# Patient Record
Sex: Female | Born: 1980 | Race: Black or African American | Hispanic: No | Marital: Married | Smoking: Never smoker
Health system: Southern US, Community
[De-identification: ages and names within clinical notes are randomized; demographics above are authoritative.]

## PROBLEM LIST (undated history)

## (undated) DIAGNOSIS — Z332 Encounter for elective termination of pregnancy: Secondary | ICD-10-CM

## (undated) HISTORY — PX: OTHER SURGICAL HISTORY: SHX169

---

## 2010-07-26 ENCOUNTER — Other Ambulatory Visit (HOSPITAL_COMMUNITY)
Admission: RE | Admit: 2010-07-26 | Discharge: 2010-07-26 | Disposition: A | Discharge status: Home / Self Care | Payer: Self-pay | Source: Ambulatory Visit | Attending: Family Medicine | Admitting: Family Medicine

## 2010-07-26 DIAGNOSIS — Z124 Encounter for screening for malignant neoplasm of cervix: Secondary | ICD-10-CM | POA: Insufficient documentation

## 2013-07-27 ENCOUNTER — Ambulatory Visit: Payer: Managed Care, Other (non HMO) | Attending: Family Medicine

## 2013-07-27 DIAGNOSIS — R5381 Other malaise: Secondary | ICD-10-CM | POA: Insufficient documentation

## 2013-07-27 DIAGNOSIS — M549 Dorsalgia, unspecified: Secondary | ICD-10-CM | POA: Insufficient documentation

## 2013-07-27 DIAGNOSIS — M545 Low back pain, unspecified: Secondary | ICD-10-CM | POA: Insufficient documentation

## 2013-07-27 DIAGNOSIS — M25659 Stiffness of unspecified hip, not elsewhere classified: Secondary | ICD-10-CM | POA: Insufficient documentation

## 2013-07-27 DIAGNOSIS — IMO0001 Reserved for inherently not codable concepts without codable children: Secondary | ICD-10-CM | POA: Insufficient documentation

## 2013-08-01 ENCOUNTER — Other Ambulatory Visit: Payer: Self-pay | Admitting: Family Medicine

## 2013-08-01 ENCOUNTER — Ambulatory Visit
Admission: RE | Admit: 2013-08-01 | Discharge: 2013-08-01 | Disposition: A | Discharge status: Home / Self Care | Payer: Managed Care, Other (non HMO) | Source: Ambulatory Visit | Attending: Family Medicine | Admitting: Family Medicine

## 2013-08-01 DIAGNOSIS — M549 Dorsalgia, unspecified: Secondary | ICD-10-CM

## 2013-08-09 ENCOUNTER — Ambulatory Visit: Payer: Managed Care, Other (non HMO) | Admitting: Physical Therapy

## 2013-08-16 ENCOUNTER — Ambulatory Visit: Payer: Managed Care, Other (non HMO) | Admitting: Physical Therapy

## 2013-08-23 ENCOUNTER — Ambulatory Visit: Payer: Managed Care, Other (non HMO) | Admitting: Physical Therapy

## 2013-08-30 ENCOUNTER — Ambulatory Visit: Payer: Managed Care, Other (non HMO) | Attending: Family Medicine | Admitting: Physical Therapy

## 2013-08-30 ENCOUNTER — Ambulatory Visit: Payer: Managed Care, Other (non HMO) | Admitting: Physical Therapy

## 2013-08-30 DIAGNOSIS — IMO0001 Reserved for inherently not codable concepts without codable children: Secondary | ICD-10-CM | POA: Insufficient documentation

## 2013-08-30 DIAGNOSIS — R5381 Other malaise: Secondary | ICD-10-CM | POA: Insufficient documentation

## 2013-08-30 DIAGNOSIS — M545 Low back pain, unspecified: Secondary | ICD-10-CM | POA: Insufficient documentation

## 2013-08-30 DIAGNOSIS — M25659 Stiffness of unspecified hip, not elsewhere classified: Secondary | ICD-10-CM | POA: Insufficient documentation

## 2013-08-30 DIAGNOSIS — M549 Dorsalgia, unspecified: Secondary | ICD-10-CM | POA: Insufficient documentation

## 2013-09-01 ENCOUNTER — Ambulatory Visit: Payer: Managed Care, Other (non HMO) | Admitting: Physical Therapy

## 2013-09-06 ENCOUNTER — Ambulatory Visit: Payer: Managed Care, Other (non HMO) | Admitting: Physical Therapy

## 2013-09-13 ENCOUNTER — Ambulatory Visit: Payer: Managed Care, Other (non HMO) | Admitting: Physical Therapy

## 2013-09-20 ENCOUNTER — Ambulatory Visit: Payer: Managed Care, Other (non HMO) | Admitting: Physical Therapy

## 2013-09-22 ENCOUNTER — Ambulatory Visit: Payer: Managed Care, Other (non HMO) | Admitting: Physical Therapy

## 2014-01-12 LAB — OB RESULTS CONSOLE HIV ANTIBODY (ROUTINE TESTING): HIV: NONREACTIVE

## 2014-01-12 LAB — OB RESULTS CONSOLE HEPATITIS B SURFACE ANTIGEN: HEP B S AG: NEGATIVE

## 2014-01-12 LAB — OB RESULTS CONSOLE RUBELLA ANTIBODY, IGM: Rubella: IMMUNE

## 2014-01-12 LAB — OB RESULTS CONSOLE GC/CHLAMYDIA
CHLAMYDIA, DNA PROBE: NEGATIVE
Gonorrhea: NEGATIVE

## 2014-01-18 LAB — OB RESULTS CONSOLE ABO/RH: RH TYPE: POSITIVE

## 2014-01-18 LAB — OB RESULTS CONSOLE ANTIBODY SCREEN: ANTIBODY SCREEN: NEGATIVE

## 2014-04-26 LAB — OB RESULTS CONSOLE HIV ANTIBODY (ROUTINE TESTING): HIV: NONREACTIVE

## 2014-04-26 LAB — OB RESULTS CONSOLE RPR: RPR: NONREACTIVE

## 2014-05-18 ENCOUNTER — Encounter: Payer: Self-pay | Admitting: Neurology

## 2014-05-18 ENCOUNTER — Ambulatory Visit (INDEPENDENT_AMBULATORY_CARE_PROVIDER_SITE_OTHER): Payer: Managed Care, Other (non HMO) | Admitting: Neurology

## 2014-05-18 VITALS — BP 118/70 | HR 96 | Ht 69.0 in | Wt 301.9 lb

## 2014-05-18 DIAGNOSIS — G5622 Lesion of ulnar nerve, left upper limb: Secondary | ICD-10-CM

## 2014-05-18 NOTE — Patient Instructions (Addendum)
1.  Start using an soft elbow pad and avoid hyperflexion of the elbow 2.  EMG of the right arm in late March 3.  Start occupational therapy 4.  Return to clinic in April

## 2014-05-18 NOTE — Progress Notes (Signed)
[facility] Neurology Division Clinic Note - Initial Visit   Date: 05/18/2014  Adriana Cheshireanisha L Rayfield MRN: [id number] DOB: 11/02/1980   Dear Dr. Kateri PlummerMorrow:   Thank you for your kind referral of Marily Lenteanisha L Kniskern for consultation of right hand numbness. Although her history is well known to you, please allow us to reiterate it for the purpose of our medical record. The patient was accompanied to the clinic by self.    History of Present Illness: Adriana Skinner is a 33 y.o. right-handed [place] female with no prior medical history presenting for evaluation of right hand paresthesias.  She is currently [redacted] weeks pregnant.  Starting in October 2015, she developing tingling over the 5th digit which started when she was sleeping. Symptoms are not constant, but can feel tingling sensation radiating from the elbow to the pinky finger.  Recently she noticed similar symptoms into the 4th finger. She also feels that her 5th fingers stays slightly separated from her 4th digit and has noted mild flexion deformity.  Denies any neck pain or weakness or similar symptoms on the left side.    She also complains of right leg sciatica which is most likely pregnancy-related.  Past medical history:  None  Past Surgical History  Procedure Laterality Date  . C-section     Medications:  Prenatal multivitamin  Allergies: No Known Allergies  Family History: Family History  Problem Relation Age of Onset  . Diabetes Mellitus II Father   . Healthy Mother   . Healthy Brother     x3  . Healthy Son   . Healthy Daughter     Social History: History   Social History  . Marital Status: Married    Spouse Name: N/A    Number of Children: N/A  . Years of Education: N/A   Occupational History  . Not on file.   Social History Main Topics  . Smoking status: Never Smoker   . Smokeless tobacco: Not on file  . Alcohol Use: No     Comment: socially   . Drug Use: No  . Sexual Activity: Not  on file   Other Topics Concern  . Not on file   Social History Narrative   She has a cleaning business.   Highest level of education:  Bachelors   She lives with husband and two children.    Review of Systems:  CONSTITUTIONAL: No fevers, chills, night sweats, or weight loss.   EYES: No visual changes or eye pain ENT: No hearing changes.  No history of nose bleeds.   RESPIRATORY: No cough, wheezing and shortness of breath.   CARDIOVASCULAR: Negative for chest pain, and palpitations.   GI: Negative for abdominal discomfort, blood in stools or black stools.  No recent change in bowel habits.   GU:  No history of incontinence.   MUSCLOSKELETAL: No history of joint pain or swelling.  No myalgias.   SKIN: Negative for lesions, rash, and itching.   HEMATOLOGY/ONCOLOGY: Negative for prolonged bleeding, bruising easily, and swollen nodes.  No history of cancer.   ENDOCRINE: Negative for cold or heat intolerance, polydipsia or goiter.   PSYCH:  No depression or anxiety symptoms.   NEURO: As Above.   Vital Signs:  BP 118/70 mmHg  Pulse 96  Ht 5\' 9"  (1.753 m)  Wt 301 lb 14.4 oz (136.941 kg)  BMI 44.56 kg/m2  SpO2 96%   General Medical Exam:   General:  Well appearing, comfortable.   Eyes/ENT: see cranial  nerve examination.   Neck: No masses appreciated.  Full range of motion without tenderness.  No carotid bruits. Respiratory:  Clear to auscultation, good air entry bilaterally.   Cardiac:  Regular rate and rhythm, no murmur.   Extremities:  No deformities, edema, or skin discoloration.  Skin:  No rashes or lesions.  Neurological Exam: MENTAL STATUS including orientation to time, place, person, recent and remote memory, attention span and concentration, language, and fund of knowledge is normal.  Speech is not dysarthric.  CRANIAL NERVES: II:  No visual field defects.  Unremarkable fundi.   III-IV-VI: Pupils equal round and reactive to light.  Normal conjugate, extra-ocular eye  movements in all directions of gaze.  No nystagmus.  No ptosis.   V:  Normal facial sensation.  Jaw jerk is absent.   VII:  Normal facial symmetry and movements.  No pathologic facial reflexes.  VIII:  Normal hearing and vestibular function.   IX-X:  Normal palatal movement.   XI:  Normal shoulder shrug and head rotation.   XII:  Normal tongue strength and range of motion, no deviation or fasciculation.  MOTOR: Right fifth digit is partly abducted and has flexion deformity at the PIP (early dupuytren's contracture?).   No atrophy, fasciculations or abnormal movements.  No pronator drift.  Tone is normal.  Froment's sign is positive on the right.  Right Upper Extremity:    Left Upper Extremity:    Deltoid  5/5   Deltoid  5/5   Biceps  5/5   Biceps  5/5   Triceps  5/5   Triceps  5/5   Wrist extensors  5/5   Wrist extensors  5/5   Wrist flexors  5/5   Wrist flexors  5/5   Finger extensors  5/5   Finger extensors  5/5   FDP 4/5 4+/5  FDP 4/5 5/5  Finger flexors  5/5   Finger flexors  5/5   Dorsal interossei  4+/5   Dorsal interossei  5/5   Abductor pollicis  5/5   Abductor pollicis  5/5   Tone (Ashworth scale)  0  Tone (Ashworth scale)  0   Right Lower Extremity:    Left Lower Extremity:    Hip flexors  5/5   Hip flexors  5/5   Hip extensors  5/5   Hip extensors  5/5   Knee flexors  5/5   Knee flexors  5/5   Knee extensors  5/5   Knee extensors  5/5   Dorsiflexors  5/5   Dorsiflexors  5/5   Plantarflexors  5/5   Plantarflexors  5/5   Toe extensors  5/5   Toe extensors  5/5   Toe flexors  5/5   Toe flexors  5/5   Tone (Ashworth scale)  0  Tone (Ashworth scale)  0   MSRs:  Reflexes are brisk and symmetric throughout (3+/4), no other upper motor neuron findings.  Hoffman's negative.  Plantars are down going.  SENSORY: Hyperesthesias to pin prick over the right lateral hand and 5th digit.  Otherwise, sensation is intact throughout.  COORDINATION/GAIT: Normal finger-to- nose-finger.   Intact rapid alternating movements bilaterally.  Able to rise from a chair without using arms.  Gait narrow based and stable.  DATA: XR lumbar spine 08/01/2013:  No acute findings. No findings to explain the patient's pain.  IMPRESSION: Ms. Sallee Langeridgen is a delightful 33 year-old female presenting for evaluation of right hand paresthesias.  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  Symptoms are intermittent and not bothersome enough to start neuralgesic medication.  Electrodiagnostic testing of the right arm will be performed if her symptoms do not improvement with conservative measures.      PLAN/RECOMMENDATIONS:  1.  Start using elbow pad and avoid compression/repetitive trauma to the elbow 2.  Avoid hyperflexion of the elbow 3.  EMG of the right arm 4.  Start occupational therapy since there is evidence of weakness involving ulnar-innervated muscles 5.  Return to clinic in 3 months   The duration of this appointment visit was 45 minutes of face-to-face time with the patient.  Greater than 50% of this time was spent in counseling, explanation of diagnosis, planning of further management, and coordination of care.   Thank you for allowing me to participate in patient's care.  If I can answer any additional questions, I would be pleased to do so.    Sincerely,    Anabelle Bungert K. Allena Katz, DO

## 2014-06-19 LAB — OB RESULTS CONSOLE GBS: GBS: NEGATIVE

## 2014-07-05 ENCOUNTER — Telehealth: Payer: Self-pay | Admitting: Neurology

## 2014-07-05 NOTE — Telephone Encounter (Signed)
Noted  

## 2014-07-05 NOTE — Telephone Encounter (Signed)
Pt is not longer having problems and canceled appt for the EMG and will call back if she needs to resch appt

## 2014-07-12 ENCOUNTER — Encounter (HOSPITAL_COMMUNITY): Payer: Self-pay

## 2014-07-14 ENCOUNTER — Encounter (HOSPITAL_COMMUNITY): Payer: Self-pay

## 2014-07-14 ENCOUNTER — Encounter (HOSPITAL_COMMUNITY)
Admission: RE | Admit: 2014-07-14 | Discharge: 2014-07-14 | Disposition: A | Discharge status: Home / Self Care | Payer: Managed Care, Other (non HMO) | Source: Ambulatory Visit | Attending: Obstetrics and Gynecology | Admitting: Obstetrics and Gynecology

## 2014-07-14 HISTORY — DX: Encounter for elective termination of pregnancy: Z33.2

## 2014-07-14 LAB — CBC
HCT: 33.2 % — ABNORMAL LOW (ref 36.0–46.0)
Hemoglobin: 11.5 g/dL — ABNORMAL LOW (ref 12.0–15.0)
MCH: 31.9 pg (ref 26.0–34.0)
MCHC: 34.6 g/dL (ref 30.0–36.0)
MCV: 92 fL (ref 78.0–100.0)
Platelets: 209 10*3/uL (ref 150–400)
RBC: 3.61 MIL/uL — AB (ref 3.87–5.11)
RDW: 13 % (ref 11.5–15.5)
WBC: 6.3 10*3/uL (ref 4.0–10.5)

## 2014-07-14 LAB — ABO/RH: ABO/RH(D): O POS

## 2014-07-14 NOTE — Patient Instructions (Addendum)
   Your procedure is scheduled on: Monday, Feb 22  Enter through the Main Entrance of [facility] at: 6 AM Pick up the phone at the desk and dial [phone] and inform us of your arrival.  Please call this number if you have any problems the morning of surgery: [phone]  Remember: Do not eat or drink after midnight: Sunday Take these medicines the morning of surgery with a SIP OF WATER: None  Do not wear jewelry, make-up, or FINGER nail polish No metal in your hair or on your body. Do not wear lotions, powders, perfumes.  You may wear deodorant.  Do not bring valuables to the hospital. Contacts, dentures or bridgework may not be worn into surgery.  Leave suitcase in the car. After Surgery it may be brought to your room. For patients being admitted to the hospital, checkout time is 11:00am the day of discharge.  Home with husband Adriana Skinner cell [phone]

## 2014-07-15 LAB — RPR: RPR Ser Ql: NONREACTIVE

## 2014-07-16 MED ORDER — DEXTROSE 5 % IV SOLN
3.0000 g | INTRAVENOUS | Status: AC
  Administered 2014-07-17: 3 g via INTRAVENOUS
  Filled 2014-07-16: qty 3000

## 2014-07-16 NOTE — H&P (Signed)
Adriana Skinner is a 34 y.o. female [phone] art 39+ weeks (EDD 07/22/14 by 11 week US and unsure LMP)  presenting for frepeat scheduled c-section given a h/o of 2 previous c-sections.  She had no other significant issues this pregnancy.  Maternal Medical History:  Contractions: Frequency: irregular.   Perceived severity is mild.    Fetal activity: Perceived fetal activity is normal.    Prenatal Complications - Diabetes: none.    OB History    Gravida Para Term Preterm AB TAB SAB Ectopic Multiple Living   5 2 2  2 2    4     EAB x 2 2007 C-Section in [place] 8#7oz Arrest of Labor 2008 9#8ox Repeat C-section  Past Medical History  Diagnosis Date  . Termination of pregnancy (fetus) 2001, 2005    x 2   Past Surgical History  Procedure Laterality Date  . C-section  2007, 2008    x 2 both in [place]  . Wisdom teeht ext     Family History: family history includes Diabetes Mellitus II in her father; Healthy in her brother, daughter, mother, and son. Social History:  reports that she has never smoked. She has never used smokeless tobacco. She reports that she drinks alcohol. She reports that she does not use illicit drugs.   Prenatal Transfer Tool  Maternal Diabetes: No Genetic Screening: Normal Maternal Ultrasounds/Referrals: Normal Fetal Ultrasounds or other Referrals:  None Maternal Substance Abuse:  No Significant Maternal Medications:  None Significant Maternal Lab Results:  None Other Comments:  None  ROS    There were no vitals taken for this visit. Maternal Exam:  Uterine Assessment: Contraction strength is mild.  Contraction frequency is irregular.   Abdomen: Patient reports no abdominal tenderness. Surgical scars: low transverse.   Introitus: Normal vulva. Normal vagina.    Physical Exam  Constitutional: She appears well-developed and well-nourished.  Cardiovascular: Normal rate and regular rhythm.   Respiratory: Effort normal and breath sounds normal.  GI: Soft.   Genitourinary: Vagina normal.  Neurological: She is alert.  Psychiatric: She has a normal mood and affect.    Prenatal labs: ABO, Rh: --/--/O POS, O POS (02/19 14780905) Antibody: NEG (02/19 0905) Rubella: Immune (08/20 0000) RPR: Non Reactive (02/19 0905)  HBsAg: Negative (08/20 0000)  HIV: Non-reactive (12/02 0000)  GBS: Negative (01/25 0000) 124 Hgb AA First trimester screen and AFP negative One hour GTT   Assessment/Plan: Patient was counseled regarding the risks and benefits of C-section and the procedure was reviewed in detail. Risks of bleeding, infection, and possible damage to bowel and bladder were reviewed, The patient would accept a blood transfusion if needed. She desires to proceed.    Oliver PilaICHARDSON,Skylen Spiering W 07/16/2014, 1:04 PM

## 2014-07-16 NOTE — Anesthesia Preprocedure Evaluation (Signed)
Anesthesia Evaluation  Patient identified by MRN, date of birth, ID band Patient awake    Reviewed: Allergy & Precautions, NPO status , Patient's Chart, lab work & pertinent test results  History of Anesthesia Complications Negative for: history of anesthetic complications  Airway Mallampati: III  TM Distance: >3 FB Neck ROM: Full    Dental no notable dental hx. (+) Dental Advisory Given   Pulmonary neg pulmonary ROS,  breath sounds clear to auscultation  Pulmonary exam normal       Cardiovascular negative cardio ROS  Rhythm:Regular Rate:Normal     Neuro/Psych negative neurological ROS  negative psych ROS   GI/Hepatic negative GI ROS, Neg liver ROS,   Endo/Other  Morbid obesity  Renal/GU negative Renal ROS  negative genitourinary   Musculoskeletal negative musculoskeletal ROS (+)   Abdominal (+) + obese,   Peds negative pediatric ROS (+)  Hematology negative hematology ROS (+)   Anesthesia Other Findings   Reproductive/Obstetrics (+) Pregnancy                             Anesthesia Physical Anesthesia Plan  ASA: III  Anesthesia Plan: Combined Spinal and Epidural and Spinal   Post-op Pain Management:    Induction:   Airway Management Planned:   Additional Equipment:   Intra-op Plan:   Post-operative Plan:   Informed Consent: I have reviewed the patients History and Physical, chart, labs and discussed the procedure including the risks, benefits and alternatives for the proposed anesthesia with the patient or authorized representative who has indicated his/her understanding and acceptance.   Dental advisory given  Plan Discussed with: CRNA  Anesthesia Plan Comments:         Anesthesia Quick Evaluation

## 2014-07-17 ENCOUNTER — Inpatient Hospital Stay (HOSPITAL_COMMUNITY): Payer: Managed Care, Other (non HMO) | Admitting: Anesthesiology

## 2014-07-17 ENCOUNTER — Encounter (HOSPITAL_COMMUNITY): Payer: Self-pay | Admitting: *Deleted

## 2014-07-17 ENCOUNTER — Encounter (HOSPITAL_COMMUNITY)
Admission: RE | Disposition: A | Discharge status: Home / Self Care | Payer: Self-pay | Source: Ambulatory Visit | Attending: Obstetrics and Gynecology

## 2014-07-17 ENCOUNTER — Inpatient Hospital Stay (HOSPITAL_COMMUNITY)
Admission: RE | Admit: 2014-07-17 | Discharge: 2014-07-20 | DRG: 766 | Disposition: A | Discharge status: Home / Self Care | Payer: Managed Care, Other (non HMO) | Source: Ambulatory Visit | Attending: Obstetrics and Gynecology | Admitting: Obstetrics and Gynecology

## 2014-07-17 DIAGNOSIS — O3421 Maternal care for scar from previous cesarean delivery: Secondary | ICD-10-CM | POA: Diagnosis present

## 2014-07-17 DIAGNOSIS — Z3A39 39 weeks gestation of pregnancy: Secondary | ICD-10-CM | POA: Diagnosis present

## 2014-07-17 DIAGNOSIS — Z98891 History of uterine scar from previous surgery: Secondary | ICD-10-CM

## 2014-07-17 DIAGNOSIS — Z3483 Encounter for supervision of other normal pregnancy, third trimester: Secondary | ICD-10-CM | POA: Diagnosis present

## 2014-07-17 LAB — PREPARE RBC (CROSSMATCH)

## 2014-07-17 SURGERY — Surgical Case
Anesthesia: Spinal | Site: Abdomen

## 2014-07-17 MED ORDER — BUPIVACAINE IN DEXTROSE 0.75-8.25 % IT SOLN
INTRATHECAL | Status: DC | PRN
  Administered 2014-07-17: 1.8 mL via INTRATHECAL

## 2014-07-17 MED ORDER — IBUPROFEN 600 MG PO TABS
600.0000 mg | ORAL_TABLET | Freq: Four times a day (QID) | ORAL | Status: DC
  Administered 2014-07-17 – 2014-07-20 (×12): 600 mg via ORAL
  Filled 2014-07-17 (×11): qty 1

## 2014-07-17 MED ORDER — NALBUPHINE HCL 10 MG/ML IJ SOLN: 5.0000 mg | INTRAMUSCULAR | Status: DC | PRN

## 2014-07-17 MED ORDER — DIPHENHYDRAMINE HCL 50 MG/ML IJ SOLN: 12.5000 mg | INTRAMUSCULAR | Status: DC | PRN

## 2014-07-17 MED ORDER — ZOLPIDEM TARTRATE 5 MG PO TABS: 5.0000 mg | ORAL_TABLET | Freq: Every evening | ORAL | Status: DC | PRN

## 2014-07-17 MED ORDER — ONDANSETRON HCL 4 MG/2ML IJ SOLN
INTRAMUSCULAR | Status: DC | PRN
  Administered 2014-07-17: 4 mg via INTRAVENOUS

## 2014-07-17 MED ORDER — NALOXONE HCL 1 MG/ML IJ SOLN
1.0000 ug/kg/h | INTRAVENOUS | Status: DC | PRN
  Filled 2014-07-17: qty 2

## 2014-07-17 MED ORDER — PHENYLEPHRINE HCL 10 MG/ML IJ SOLN
INTRAMUSCULAR | Status: DC | PRN
  Administered 2014-07-17: 80 ug via INTRAVENOUS

## 2014-07-17 MED ORDER — PHENYLEPHRINE 8 MG IN D5W 100 ML (0.08MG/ML) PREMIX OPTIME
INJECTION | INTRAVENOUS | Status: DC | PRN
  Administered 2014-07-17: 60 ug/min via INTRAVENOUS

## 2014-07-17 MED ORDER — ACETAMINOPHEN 500 MG PO TABS
1000.0000 mg | ORAL_TABLET | Freq: Four times a day (QID) | ORAL | Status: AC
Start: 2014-07-17 — End: 2014-07-18
  Administered 2014-07-17 (×2): 1000 mg via ORAL
  Filled 2014-07-17 (×2): qty 2

## 2014-07-17 MED ORDER — SCOPOLAMINE 1 MG/3DAYS TD PT72
MEDICATED_PATCH | TRANSDERMAL | Status: AC
  Administered 2014-07-17: 1.5 mg via TRANSDERMAL
  Filled 2014-07-17: qty 1

## 2014-07-17 MED ORDER — MENTHOL 3 MG MT LOZG: 1.0000 | LOZENGE | OROMUCOSAL | Status: DC | PRN

## 2014-07-17 MED ORDER — OXYTOCIN 10 UNIT/ML IJ SOLN
INTRAMUSCULAR | Status: AC
  Filled 2014-07-17: qty 4

## 2014-07-17 MED ORDER — DIPHENHYDRAMINE HCL 25 MG PO CAPS: 25.0000 mg | ORAL_CAPSULE | ORAL | Status: DC | PRN

## 2014-07-17 MED ORDER — FENTANYL CITRATE 0.05 MG/ML IJ SOLN
INTRAMUSCULAR | Status: AC
  Filled 2014-07-17: qty 2

## 2014-07-17 MED ORDER — NALBUPHINE HCL 10 MG/ML IJ SOLN
5.0000 mg | Freq: Once | INTRAMUSCULAR | Status: AC | PRN
Start: 2014-07-17 — End: 2014-07-17

## 2014-07-17 MED ORDER — MORPHINE SULFATE (PF) 0.5 MG/ML IJ SOLN
INTRAMUSCULAR | Status: DC | PRN
  Administered 2014-07-17: .2 mg via EPIDURAL

## 2014-07-17 MED ORDER — KETOROLAC TROMETHAMINE 30 MG/ML IJ SOLN
30.0000 mg | Freq: Four times a day (QID) | INTRAMUSCULAR | Status: AC | PRN
  Administered 2014-07-17: 30 mg via INTRAMUSCULAR

## 2014-07-17 MED ORDER — PHENYLEPHRINE 8 MG IN D5W 100 ML (0.08MG/ML) PREMIX OPTIME
INJECTION | INTRAVENOUS | Status: AC
  Filled 2014-07-17: qty 100

## 2014-07-17 MED ORDER — FENTANYL CITRATE 0.05 MG/ML IJ SOLN
INTRAMUSCULAR | Status: DC | PRN
Start: 2014-07-17 — End: 2014-07-17
  Administered 2014-07-17: 10 ug via INTRAVENOUS

## 2014-07-17 MED ORDER — ERYTHROMYCIN 5 MG/GM OP OINT
TOPICAL_OINTMENT | OPHTHALMIC | Status: AC
  Filled 2014-07-17: qty 1

## 2014-07-17 MED ORDER — SCOPOLAMINE 1 MG/3DAYS TD PT72
1.0000 | MEDICATED_PATCH | Freq: Once | TRANSDERMAL | Status: DC
  Administered 2014-07-17: 1.5 mg via TRANSDERMAL

## 2014-07-17 MED ORDER — LACTATED RINGERS IV SOLN: INTRAVENOUS | Status: DC

## 2014-07-17 MED ORDER — KETOROLAC TROMETHAMINE 30 MG/ML IJ SOLN
INTRAMUSCULAR | Status: AC
  Filled 2014-07-17: qty 1

## 2014-07-17 MED ORDER — MORPHINE SULFATE 0.5 MG/ML IJ SOLN
INTRAMUSCULAR | Status: AC
  Filled 2014-07-17: qty 10

## 2014-07-17 MED ORDER — NALBUPHINE HCL 10 MG/ML IJ SOLN: 5.0000 mg | Freq: Once | INTRAMUSCULAR | Status: AC | PRN

## 2014-07-17 MED ORDER — SIMETHICONE 80 MG PO CHEW
80.0000 mg | CHEWABLE_TABLET | ORAL | Status: DC
  Administered 2014-07-17 – 2014-07-20 (×3): 80 mg via ORAL
  Filled 2014-07-17 (×3): qty 1

## 2014-07-17 MED ORDER — OXYCODONE-ACETAMINOPHEN 5-325 MG PO TABS
1.0000 | ORAL_TABLET | ORAL | Status: DC | PRN
  Administered 2014-07-18 (×2): 1 via ORAL
  Filled 2014-07-17 (×2): qty 1

## 2014-07-17 MED ORDER — LACTATED RINGERS IV SOLN
INTRAVENOUS | Status: DC
  Administered 2014-07-17: 16:00:00 via INTRAVENOUS

## 2014-07-17 MED ORDER — OXYTOCIN 10 UNIT/ML IJ SOLN
40.0000 [IU] | INTRAVENOUS | Status: DC | PRN
  Administered 2014-07-17: 40 [IU] via INTRAVENOUS

## 2014-07-17 MED ORDER — DIPHENHYDRAMINE HCL 25 MG PO CAPS: 25.0000 mg | ORAL_CAPSULE | Freq: Four times a day (QID) | ORAL | Status: DC | PRN

## 2014-07-17 MED ORDER — MEPERIDINE HCL 25 MG/ML IJ SOLN
6.2500 mg | INTRAMUSCULAR | Status: DC | PRN
Start: 2014-07-17 — End: 2014-07-17

## 2014-07-17 MED ORDER — ONDANSETRON HCL 4 MG/2ML IJ SOLN
INTRAMUSCULAR | Status: AC
  Filled 2014-07-17: qty 2

## 2014-07-17 MED ORDER — NALOXONE HCL 0.4 MG/ML IJ SOLN: 0.4000 mg | INTRAMUSCULAR | Status: DC | PRN

## 2014-07-17 MED ORDER — TETANUS-DIPHTH-ACELL PERTUSSIS 5-2.5-18.5 LF-MCG/0.5 IM SUSP: 0.5000 mL | Freq: Once | INTRAMUSCULAR | Status: DC

## 2014-07-17 MED ORDER — KETOROLAC TROMETHAMINE 30 MG/ML IJ SOLN: 30.0000 mg | Freq: Four times a day (QID) | INTRAMUSCULAR | Status: AC | PRN

## 2014-07-17 MED ORDER — OXYCODONE-ACETAMINOPHEN 5-325 MG PO TABS
2.0000 | ORAL_TABLET | ORAL | Status: DC | PRN
  Administered 2014-07-19 – 2014-07-20 (×5): 2 via ORAL
  Filled 2014-07-17 (×5): qty 2

## 2014-07-17 MED ORDER — DIBUCAINE 1 % RE OINT: 1.0000 "application " | TOPICAL_OINTMENT | RECTAL | Status: DC | PRN

## 2014-07-17 MED ORDER — ONDANSETRON HCL 4 MG/2ML IJ SOLN: 4.0000 mg | INTRAMUSCULAR | Status: DC | PRN

## 2014-07-17 MED ORDER — ONDANSETRON HCL 4 MG/2ML IJ SOLN: 4.0000 mg | Freq: Three times a day (TID) | INTRAMUSCULAR | Status: DC | PRN

## 2014-07-17 MED ORDER — ONDANSETRON HCL 4 MG/2ML IJ SOLN: INTRAMUSCULAR | Status: DC | PRN

## 2014-07-17 MED ORDER — WITCH HAZEL-GLYCERIN EX PADS: 1.0000 "application " | MEDICATED_PAD | CUTANEOUS | Status: DC | PRN

## 2014-07-17 MED ORDER — ONDANSETRON HCL 4 MG PO TABS: 4.0000 mg | ORAL_TABLET | ORAL | Status: DC | PRN

## 2014-07-17 MED ORDER — SENNOSIDES-DOCUSATE SODIUM 8.6-50 MG PO TABS
2.0000 | ORAL_TABLET | ORAL | Status: DC
  Administered 2014-07-17 – 2014-07-20 (×3): 2 via ORAL
  Filled 2014-07-17 (×3): qty 2

## 2014-07-17 MED ORDER — ONDANSETRON HCL 4 MG/2ML IJ SOLN: 4.0000 mg | Freq: Once | INTRAMUSCULAR | Status: DC | PRN

## 2014-07-17 MED ORDER — LANOLIN HYDROUS EX OINT: 1.0000 "application " | TOPICAL_OINTMENT | CUTANEOUS | Status: DC | PRN

## 2014-07-17 MED ORDER — PHENYLEPHRINE 40 MCG/ML (10ML) SYRINGE FOR IV PUSH (FOR BLOOD PRESSURE SUPPORT)
PREFILLED_SYRINGE | INTRAVENOUS | Status: AC
  Filled 2014-07-17: qty 10

## 2014-07-17 MED ORDER — SCOPOLAMINE 1 MG/3DAYS TD PT72
1.0000 | MEDICATED_PATCH | Freq: Once | TRANSDERMAL | Status: DC
  Filled 2014-07-17: qty 1

## 2014-07-17 MED ORDER — LACTATED RINGERS IV SOLN
Freq: Once | INTRAVENOUS | Status: AC
  Administered 2014-07-17: 07:00:00 via INTRAVENOUS

## 2014-07-17 MED ORDER — SIMETHICONE 80 MG PO CHEW: 80.0000 mg | CHEWABLE_TABLET | ORAL | Status: DC | PRN

## 2014-07-17 MED ORDER — SIMETHICONE 80 MG PO CHEW
80.0000 mg | CHEWABLE_TABLET | Freq: Three times a day (TID) | ORAL | Status: DC
  Administered 2014-07-18 – 2014-07-19 (×6): 80 mg via ORAL
  Filled 2014-07-17 (×6): qty 1

## 2014-07-17 MED ORDER — FENTANYL CITRATE 0.05 MG/ML IJ SOLN: 25.0000 ug | INTRAMUSCULAR | Status: DC | PRN

## 2014-07-17 MED ORDER — OXYTOCIN 40 UNITS IN LACTATED RINGERS INFUSION - SIMPLE MED: 62.5000 mL/h | INTRAVENOUS | Status: AC

## 2014-07-17 MED ORDER — LACTATED RINGERS IV SOLN
INTRAVENOUS | Status: DC
  Administered 2014-07-17 (×3): via INTRAVENOUS

## 2014-07-17 MED ORDER — 0.9 % SODIUM CHLORIDE (POUR BTL) OPTIME
TOPICAL | Status: DC | PRN
  Administered 2014-07-17: 1000 mL

## 2014-07-17 MED ORDER — PRENATAL MULTIVITAMIN CH
1.0000 | ORAL_TABLET | Freq: Every day | ORAL | Status: DC
  Administered 2014-07-18 – 2014-07-19 (×2): 1 via ORAL
  Filled 2014-07-17 (×2): qty 1

## 2014-07-17 MED ORDER — SODIUM CHLORIDE 0.9 % IJ SOLN: 3.0000 mL | INTRAMUSCULAR | Status: DC | PRN

## 2014-07-17 SURGICAL SUPPLY — 33 items
BENZOIN TINCTURE PRP APPL 2/3 (GAUZE/BANDAGES/DRESSINGS) ×3 IMPLANT
CLAMP CORD UMBIL (MISCELLANEOUS) IMPLANT
CLOSURE WOUND 1/2 X4 (GAUZE/BANDAGES/DRESSINGS) ×2
CLOTH BEACON ORANGE TIMEOUT ST (SAFETY) ×3 IMPLANT
DRAPE SHEET LG 3/4 BI-LAMINATE (DRAPES) IMPLANT
DRSG OPSITE POSTOP 4X10 (GAUZE/BANDAGES/DRESSINGS) ×3 IMPLANT
DURAPREP 26ML APPLICATOR (WOUND CARE) ×3 IMPLANT
ELECT REM PT RETURN 9FT ADLT (ELECTROSURGICAL) ×3
ELECTRODE REM PT RTRN 9FT ADLT (ELECTROSURGICAL) ×1 IMPLANT
EXTRACTOR VACUUM KIWI (MISCELLANEOUS) IMPLANT
GLOVE BIO SURGEON STRL SZ 6.5 (GLOVE) ×2 IMPLANT
GLOVE BIO SURGEONS STRL SZ 6.5 (GLOVE) ×1
GOWN STRL REUS W/TWL LRG LVL3 (GOWN DISPOSABLE) ×6 IMPLANT
HOVERMATT SINGLE USE (MISCELLANEOUS) ×3 IMPLANT
KIT ABG SYR 3ML LUER SLIP (SYRINGE) IMPLANT
NEEDLE HYPO 25X5/8 SAFETYGLIDE (NEEDLE) IMPLANT
NS IRRIG 1000ML POUR BTL (IV SOLUTION) ×3 IMPLANT
PACK C SECTION WH (CUSTOM PROCEDURE TRAY) ×3 IMPLANT
PAD OB MATERNITY 4.3X12.25 (PERSONAL CARE ITEMS) ×3 IMPLANT
RTRCTR C-SECT PINK 25CM LRG (MISCELLANEOUS) ×3 IMPLANT
STRIP CLOSURE SKIN 1/2X4 (GAUZE/BANDAGES/DRESSINGS) ×4 IMPLANT
SUT CHROMIC 1 CTX 36 (SUTURE) ×6 IMPLANT
SUT PLAIN 0 NONE (SUTURE) IMPLANT
SUT PLAIN 2 0 XLH (SUTURE) ×3 IMPLANT
SUT VIC AB 0 CT1 27 (SUTURE) ×4
SUT VIC AB 0 CT1 27XBRD ANBCTR (SUTURE) ×2 IMPLANT
SUT VIC AB 2-0 CT1 27 (SUTURE) ×2
SUT VIC AB 2-0 CT1 TAPERPNT 27 (SUTURE) ×1 IMPLANT
SUT VIC AB 3-0 CT1 27 (SUTURE)
SUT VIC AB 3-0 CT1 TAPERPNT 27 (SUTURE) IMPLANT
SUT VIC AB 4-0 KS 27 (SUTURE) ×3 IMPLANT
TOWEL OR 17X24 6PK STRL BLUE (TOWEL DISPOSABLE) ×3 IMPLANT
TRAY FOLEY CATH 14FR (SET/KITS/TRAYS/PACK) ×3 IMPLANT

## 2014-07-17 NOTE — Anesthesia Procedure Notes (Signed)
Spinal Patient location during procedure: OR Start time: 07/17/2014 7:20 AM Staffing Anesthesiologist: Felipe DroneJUDD, Nancie Bocanegra JENNETTE Performed by: anesthesiologist  Preanesthetic Checklist Completed: patient identified, site marked, surgical consent, pre-op evaluation, timeout performed, IV checked, risks and benefits discussed and monitors and equipment checked Spinal Block Patient position: sitting Prep: DuraPrep Patient monitoring: continuous pulse ox, blood pressure and heart rate Approach: midline Location: L3-4 Injection technique: single-shot Needle Needle type: Sprotte  Needle gauge: 24 G Needle length: 9 cm Assessment Sensory level: T4 Additional Notes Functioning IV was confirmed and monitors were applied. Sterile prep and drape, including hand hygiene, mask and sterile gloves were used. The patient was positioned and the spine was prepped. The skin was anesthetized with lidocaine.  Free flow of clear CSF was obtained prior to injecting local anesthetic into the CSF.  The spinal needle aspirated freely following injection.  The needle was carefully withdrawn.  The patient tolerated the procedure well. Consent was obtained prior to procedure with all questions answered and concerns addressed. Risks including but not limited to bleeding, infection, nerve damage, paralysis, failed block, inadequate analgesia, allergic reaction, high spinal, itching and headache were discussed and the patient wished to proceed.   Karie SchwalbeMary Derenda Giddings, MD

## 2014-07-17 NOTE — Anesthesia Postprocedure Evaluation (Signed)
  Anesthesia Post-op Note  Patient: Adriana Skinner  Procedure(s) Performed: Procedure(s) (LRB): CESAREAN SECTION-repeat  (N/A)  Patient Location: PACU  Anesthesia Type: Spinal  Level of Consciousness: awake and alert   Airway and Oxygen Therapy: Patient Spontanous Breathing  Post-op Pain: mild  Post-op Assessment: Post-op Vital signs reviewed, Patient's Cardiovascular Status Stable, Respiratory Function Stable, Patent Airway and No signs of Nausea or vomiting  Last Vitals:  Filed Vitals:   07/17/14 1015  BP:   Pulse: 71  Temp:   Resp: 15    Post-op Vital Signs: stable   Complications: No apparent anesthesia complications

## 2014-07-17 NOTE — Progress Notes (Signed)
Patient ID: Adriana Skinner, female   DOB: [birth year], 34 y.o.   MRN: [id number] Per pt no changes in dictated H&P.  Brief exam WNL.  Ready to proceed.

## 2014-07-17 NOTE — Transfer of Care (Signed)
Immediate Anesthesia Transfer of Care Note  Patient: Adriana Skinner  Procedure(s) Performed: Procedure(s): CESAREAN SECTION-repeat  (N/A)  Patient Location: PACU  Anesthesia Type:Spinal  Level of Consciousness: awake, alert  and oriented  Airway & Oxygen Therapy: Patient Spontanous Breathing  Post-op Assessment: Report given to RN, Post -op Vital signs reviewed and stable and Patient moving all extremities  Post vital signs: Reviewed and stable  Last Vitals:  Filed Vitals:   07/17/14 0615  BP: 155/97  Pulse: 93  Temp: 36.9 C  Resp: 18    Complications: No apparent anesthesia complications

## 2014-07-17 NOTE — Anesthesia Postprocedure Evaluation (Signed)
Anesthesia Post Note  Patient: Adriana Skinner  Procedure(s) Performed: Procedure(s) (LRB): CESAREAN SECTION-repeat  (N/A)  Anesthesia type: Spinal  Patient location: Mother/Baby  Post pain: Pain level controlled  Post assessment: Post-op Vital signs reviewed  Last Vitals:  Filed Vitals:   07/17/14 1318  BP: 142/76  Pulse: 73  Temp: 36.8 C  Resp: 20    Post vital signs: Reviewed  Level of consciousness: awake  Complications: No apparent anesthesia complications

## 2014-07-17 NOTE — Lactation Note (Signed)
This note was copied from the chart of Girl Kelby Famanisha Laba. Lactation Consultation Note  Patient Name: Girl Kelby Famanisha Bonine ZOXWR'UToday's Date: 07/17/2014 Reason for consult: Initial assessment Mom is an experienced breast feeder. Baby is 6 hours old and has been to the breast both breast since birth. Voided and stooled. Per mom the left breast ( nipple ) poses a challenge because it isn't has erect as the right. LC assisted with latch after changing 2 wet diapers. Due to semi - erect nipple on the left , mom  needed assist with depth. Once the baby fed for 10 mins with multiply swallows, increased with breast compressions. LC assisted with latch on the right breast and baby had an easier latch with depth. Multiply swallows noted , increased with  breast compressions. Baby fed for 14 mins. And released on her own . LC assisted with placing baby skin to skin on moms chest. Mother informed of post-discharge support and given phone number to the lactation department, including services for phone call assistance;  out-patient appointments; and breastfeeding support group. List of other breastfeeding resources in the community given in the handout. Encouraged  mother to call for problems or concerns related to breastfeeding.     Maternal Data Has patient been taught Hand Expression?: Yes Does the patient have breastfeeding experience prior to this delivery?: Yes  Feeding Feeding Type: Breast Fed Length of feed: 14 min (still feeding at 14 mins )  LATCH Score/Interventions Latch: Grasps breast easily, tongue down, lips flanged, rhythmical sucking.  Audible Swallowing: Spontaneous and intermittent  Type of Nipple: Everted at rest and after stimulation  Comfort (Breast/Nipple): Soft / non-tender     Hold (Positioning): Assistance needed to correctly position infant at breast and maintain latch. Intervention(s): Breastfeeding basics reviewed;Support Pillows;Position options;Skin to  skin  LATCH Score: 9  Lactation Tools Discussed/Used WIC Program: No   Consult Status Consult Status: Follow-up Date: 07/18/14 Follow-up type: In-patient    Kathrin Greathouseorio, Ralphine Hinks Ann 07/17/2014, 2:45 PM

## 2014-07-17 NOTE — Op Note (Signed)
Operative Note  Preoperative Diagnosis Term pregnancy at 39 weeks Prior c-section x 2  Postoperative diagnosis Same  Procedure Repeat low transverse cesarean section with 2 layer closure of uterus  Surgeon Dr. Huel CoteKathy Ceriah Kohler Dr. Tawanna Coolerodd Meisinger  Anesthesia Spinal  Fluids Estimated blood loss 800 cc Urine output 200 cc clear urine IV fluid 2200 cc LR  Findings There was a viable female infant in the vertex presentation. Loose nuchal cord 1 was reduced at delivery. A vacuum was utilized to deliver the vertex from the incision. Apgars were 8 and 9 weight pending at time of dictation. The tubes appeared normal bilaterally except for a benign-appearing paratubal cyst on the patient's left which was drained. The ovaries were normal.  There were some omental adhesions to the anterior abdominal wall and the uterine fundus. There was also a dense adhesion between the uterus and the right rectus muscle which was taken down.  Specimen Placenta sent to L&D  Procedure note  Patient was taken to the operating room where spinal anesthesia was obtained without difficulty and found to be adequate by Allis clamp test. She was prepped and draped in the normal sterile fashion in the dorsal supine position with a leftward tilt. An appropriate time out was performed. A Pfannenstiel skin incision was then made with the scalpel through a pre-existing scar and carried through to the underlying layer of fascia by sharp dissection and Bovie cautery. The fascia was nicked in the midline and the incision was extended laterally with Mayo scissors. The inferior aspect of the incision was grasped Coker clamps and dissected off the underlying rectus muscles. In a similar fashion the superior aspect was dissected off the rectus muscles. Rectus muscles were separated in the midline and the peritoneal cavity entered bluntly. There were some omental adhesions to the anterior abdominal wall which were taken down with  Bovie cautery. There was also a dense adhesion from the uterine fundus to the right rectus muscles which was limiting the incision and was thus taken down with Bovie cautery. The peritoneal incision was then extended both superiorly and inferiorly with careful attention to avoid both bowel and bladder. The Alexis self-retaining wound retractor was then placed within the incision and the lower uterine segment exposed. The lower uterine segment was then incised in a transverse fashion and the cavity itself entered bluntly. The incision was extended bluntly. The infant's head was then lifted and delivered from the incision without difficulty. The remainder of the infant delivered and the nose and mouth bulb suctioned with the cord clamped and cut as well. The infant was handed off to the waiting pediatricians. The placenta was then spontaneously expressed from the uterus and the uterus cleared of all clots and debris with moist lap sponge. The uterine incision was then repaired in 2 layers the first layer was a running locked layer 0 Vicryl and the second an imbricating layer of 1-0 chromic. The tubes and ovaries were inspected and the gutters cleared of all clots and debris. The uterine incision was inspected and found to be hemostatic. All instruments and sponges as well as the Alexis retractor were then removed from the abdomen. The rectus muscles and peritoneum were then reapproximated with a running suture of 2-0 Vicryl. The fascia was then closed with 0 Vicryl in a running fashion. Subcutaneous tissue was reapproximated with 3-0 plain in a running fashion. The skin was closed with a subcuticular stitch of 4-0 Vicryl on a Keith needle and then reinforced with benzoin and  Steri-Strips. At the conclusion of the procedure all instruments and sponge counts were correct. Patient was taken to the recovery room in good condition with her baby accompanying her skin to skin.

## 2014-07-17 NOTE — Consult Note (Signed)
Neonatology Note:   Attendance at C-section:    I was asked by Dr. Senaida Oresichardson to attend this repeat C/S at term. The mother is a G5P2A2 O pos, GBS neg with an uncomplicated pregnancy. ROM at delivery, fluid clear. Vacuum-assisted delivery.  Infant vigorous with good spontaneous cry and tone. Needed bulb suctioning for moderate clear secretions. Ap 7/9. Lungs clear to ausc in DR. To CN to care of Pediatrician.   Doretha Souhristie C. Jazelle Achey, MD

## 2014-07-17 NOTE — Addendum Note (Signed)
Addendum  created 07/17/14 1428 by Algis GreenhouseLinda A Jamesen Stahnke, CRNA   Modules edited: Notes Section   Notes Section:  File: [id number]

## 2014-07-18 ENCOUNTER — Encounter (HOSPITAL_COMMUNITY): Payer: Self-pay | Admitting: Obstetrics and Gynecology

## 2014-07-18 LAB — CBC
HEMATOCRIT: 32.9 % — AB (ref 36.0–46.0)
HEMOGLOBIN: 10.9 g/dL — AB (ref 12.0–15.0)
MCH: 30.8 pg (ref 26.0–34.0)
MCHC: 33.1 g/dL (ref 30.0–36.0)
MCV: 92.9 fL (ref 78.0–100.0)
Platelets: 180 10*3/uL (ref 150–400)
RBC: 3.54 MIL/uL — ABNORMAL LOW (ref 3.87–5.11)
RDW: 13.1 % (ref 11.5–15.5)
WBC: 8 10*3/uL (ref 4.0–10.5)

## 2014-07-18 LAB — TYPE AND SCREEN
ABO/RH(D): O POS
ANTIBODY SCREEN: NEGATIVE
UNIT DIVISION: 0
Unit division: 0

## 2014-07-18 NOTE — Lactation Note (Addendum)
This note was copied from the chart of Adriana Skinner. Lactation Consultation Note: Follow up visit with this experienced BF mom. She reports that baby has been nursing well. Last fed about 1 hour ago for 25 min. Reports no pain with latch. Asking when he milk will come in.  Reviewed normal boundaries as when milk supply should increase. No questions at present. To call prn  Patient Name: Adriana Kelby Famanisha Mcmann VWUJW'JToday's Date: 07/18/2014 Reason for consult: Follow-up assessment   Maternal Data Formula Feeding for Exclusion: No Does the patient have breastfeeding experience prior to this delivery?: Yes  Feeding   LATCH Score/Interventions                      Lactation Tools Discussed/Used     Consult Status Consult Status: PRN    Pamelia HoitWeeks, Deya Bigos D 07/18/2014, 11:27 AM

## 2014-07-18 NOTE — Progress Notes (Signed)
Subjective: Postpartum Day 1 Cesarean Delivery Patient reports incisional pain and tolerating PO.    Objective: Vital signs in last 24 hours: Temp:  [97.3 F (36.3 C)-98.9 F (37.2 C)] 98.5 F (36.9 C) (02/23 0418) Pulse Rate:  [61-90] 69 (02/23 0418) Resp:  [15-25] 18 (02/23 0418) BP: (111-142)/(47-93) 139/64 mmHg (02/23 0418) SpO2:  [96 %-100 %] 98 % (02/23 0418)  Physical Exam:  General: alert and cooperative Lochia: appropriate Uterine Fundus: firm Incision: C/D/I    Recent Labs  07/18/14 0600  HGB 10.9*  HCT 32.9*    Assessment/Plan: Status post Cesarean section. Doing well postoperatively.  Continue current care.  Oliver PilaICHARDSON,Rosemarie Galvis W 07/18/2014, 8:40 AM

## 2014-07-19 MED ORDER — OXYCODONE-ACETAMINOPHEN 5-325 MG PO TABS: 1.0000 | ORAL_TABLET | ORAL | Status: DC | PRN

## 2014-07-19 MED ORDER — IBUPROFEN 600 MG PO TABS: 600.0000 mg | ORAL_TABLET | Freq: Four times a day (QID) | ORAL | Status: DC

## 2014-07-19 NOTE — Progress Notes (Signed)
Subjective: Postpartum Day 2 Cesarean Delivery Patient reports tolerating PO and no problems voiding.    Objective: Vital signs in last 24 hours: Temp:  [98 F (36.7 C)-98.4 F (36.9 C)] 98 F (36.7 C) (02/24 0555) Pulse Rate:  [68-78] 68 (02/24 0555) Resp:  [18] 18 (02/24 0555) BP: (137-143)/(62-72) 137/72 mmHg (02/24 0555)  Physical Exam:  General: alert and cooperative Lochia: appropriate Uterine Fundus: firm Incision: C/D/I DVT Evaluation: No evidence of DVT seen on physical exam.   Recent Labs  07/18/14 0600  HGB 10.9*  HCT 32.9*    Assessment/Plan: Status post Cesarean section. Doing well postoperatively.  Discharge home with standard precautions and return to office in 2 weeks  Maegan Buller W 07/19/2014, 9:23 AM

## 2014-07-19 NOTE — Lactation Note (Addendum)
This note was copied from the chart of Adriana Skinner. Lactation Consultation Note  Patient Name: Adriana Kelby Famanisha Stoutenburg WGNFA'OToday's Date: 07/19/2014 Reason for consult: Follow-up assessment;Hyperbilirubinemia;Infant weight loss  Baby is 4452 hours old and is at 9% weight loss , per mom recently  fed 25 mins and then  she supplemented with 10 ml of EBM from  a Bottle. LC offered a 58F feeding tube / SNS for supplementing at the breast. Per mom I prefer  To supplement with a bottle , but I will be willing to post pump after 4 feedings  LC recommended to feed at both breast 1st and then supplement. LC did notice the baby has a short labial frenulum ( upper lip stretches and a short anterior frenulum)  Due to moms areolas being compressible does latch with depth. The latch observed at this consult the baby had recently fed 25 mins and had been supplemented . Baby didn't act very hungry with latch. LC recommended to mom to call for a feeding assessment . Reassured mom of the (+ ) signs she can pump off 25 ml and her breast are getting heavier and fuller. Discussed with mom how a baby acts when they have 9% weight loss , and cluster feeding.   Maternal Data Has patient been taught Hand Expression?: Yes  Feeding Feeding Type: Breast Fed Length of feed: 3 min (2-3 mins and released and fell asleep )  LATCH Score/Interventions Latch: Grasps breast easily, tongue down, lips flanged, rhythmical sucking. (left breast ) Intervention(s): Adjust position;Assist with latch;Breast massage;Breast compression  Audible Swallowing: A few with stimulation  Type of Nipple: Everted at rest and after stimulation  Comfort (Breast/Nipple): Soft / non-tender     Hold (Positioning): Assistance needed to correctly position infant at breast and maintain latch. Intervention(s): Breastfeeding basics reviewed;Support Pillows;Position options;Skin to skin  LATCH Score: 8  Lactation Tools Discussed/Used      Consult Status Consult Status: Follow-up Date: 07/20/14 Follow-up type: In-patient    Kathrin Greathouseorio, Hurbert Duran Ann 07/19/2014, 12:34 PM

## 2014-07-19 NOTE — Discharge Summary (Addendum)
Obstetric Discharge Summary Reason for Admission: cesarean section Prenatal Procedures: none Intrapartum Procedures: cesarean: low cervical, transverse Postpartum Procedures: none Complications-Operative and Postpartum: none HEMOGLOBIN  Date Value Ref Range Status  07/18/2014 10.9* 12.0 - 15.0 g/dL Final   HCT  Date Value Ref Range Status  07/18/2014 32.9* 36.0 - 46.0 % Final    Physical Exam:  General: alert and cooperative Lochia: appropriate Uterine Fundus: firm Incision: C/D/I   Discharge Diagnoses: Term Pregnancy-delivered  Discharge Information: Date: 07/19/2014 Activity: pelvic rest Diet: routine Medications: Ibuprofen and Percocet Condition: improved Instructions: refer to practice specific booklet Discharge to: home Follow-up Information    Follow up with Adriana Skinner,Adriana Lampkins Skinner, Adriana Skinner. Schedule an appointment as soon as possible for a visit in 2 weeks.   Specialty:  Obstetrics and Gynecology   Why:  incision check   Contact information:   [address] [place] [phone]       Newborn Data: Live born female  Birth Weight: 8 lb 13.1 oz (4000 g) APGAR: 7, 9  Home with mother.  Adriana Skinner,Adriana Skinner 07/19/2014, 9:25 AM   Pt stayed an additional day because pediatrics did not d/c baby--watching weight loss.  Milk now coming in and pt should go home today.

## 2014-07-20 NOTE — Progress Notes (Signed)
Subjective: Postpartum Day 3: Cesarean Delivery Patient reports tolerating PO and no problems voiding.  D/c canceled yesterday due to baby weight loss, milk coming in today  Objective: Vital signs in last 24 hours: Temp:  [97.8 F (36.6 C)-98 F (36.7 C)] 98 F (36.7 C) (02/25 0647) Pulse Rate:  [73-84] 73 (02/25 0647) Resp:  [18-20] 20 (02/25 0647) BP: (129-133)/(68-81) 129/68 mmHg (02/25 16100647)  Physical Exam:  General: alert and cooperative Lochia: appropriate Uterine Fundus: firm Incision: c/d/i    Recent Labs  07/18/14 0600  HGB 10.9*  HCT 32.9*    Assessment/Plan: Status post Cesarean section. Doing well postoperatively.  Discharge home with standard precautions and return to clinic in 2 weeks  Tylee Newby W 07/20/2014, 8:31 AM

## 2014-07-20 NOTE — Lactation Note (Signed)
This note was copied from the chart of Adriana Skinner. Lactation Consultation Note Baby had 11% weight loss. Has been spitty, lots of poops and pee's. Mom states baby BF well. Then acts full then spits up. Abd. Slight round towards outter side. Flat to top of abd. Lt. Breast engorged, nipple flat and inverted. Leaking. ICE applied, hand expressed 15ml to soften, attempted to latch baby w/#20 NS, baby gaggy. Not interest. Encouraged mom to use DEBP to relieve engorgement, massage breast to express. Gave #24 NS as well. Shells given to assist in everting Lt. Nipple but encouraged not to wear until breast softens. C/o soreness to nipples, comfort gels applied. Noted baby w/tight frenulum to tongue. Mom states baby latches to Rt. Breast but not Lt. Encouraged to use NS to latch until that nipple everts more. Reported to on coming LC. Patient Name: Adriana Kelby Famanisha Hepner AOZHY'QToday's Date: 07/20/2014 Reason for consult: Follow-up assessment;Infant weight loss   Maternal Data    Feeding Feeding Type: Breast Fed Length of feed:  (Baby would not feed )  LATCH Score/Interventions    Intervention(s): Hand expression;Alternate breast massage  Type of Nipple: Everted at rest and after stimulation (Lt. flat, engorged.) Intervention(s): Double electric pump;Shells  Comfort (Breast/Nipple): Engorged, cracked, bleeding, large blisters, severe discomfort Problem noted: Engorgment Intervention(s): Ice;Hand expression     Hold (Positioning): Assistance needed to correctly position infant at breast and maintain latch. Intervention(s): Skin to skin;Position options;Support Pillows;Breastfeeding basics reviewed     Lactation Tools Discussed/Used Tools: Shells;Nipple Shields;Pump;Comfort gels Nipple shield size: 20;24 Shell Type: Inverted Breast pump type: Double-Electric Breast Pump   Consult Status Consult Status: Follow-up Date: 07/20/14 Follow-up type: In-patient    Christell Steinmiller, Diamond NickelLAURA  G 07/20/2014, 8:40 AM

## 2014-07-20 NOTE — Lactation Note (Addendum)
This note was copied from the chart of Adriana Skinner. Lactation Consultation Note  Patient Name: Adriana Kelby Famanisha Reeg UJWJX'BToday's Date: 07/20/2014 Reason for consult: Follow-up assessment  Baby is 7575 hours old and moms milk is in both breast , resolving engorgement . Baby awake and hungry and latched on the right breast and fed 10 mins , while LC had mom  Pumping on the left breast with a larger flange to relief engorgement. Able to pump off 10 ml with LC assist  To massage boarder line engorged areas, breast softened up. LC reviewed sore nipple and engorgement prevention and tx . Referring to the Baby and me booklet.pages 24 -25. Per mom has a DEBP at home.  Mother informed of post-discharge support and given phone number to the lactation department, including services for phone call assistance; out-patient appointments; and breastfeeding support group. List of other breastfeeding resources in the community given in the handout. Encouraged mother to call for problems or concerns related to breastfeeding. [facility] - decreased loss to 10.3 % loss    Maternal Data    Feeding Feeding Type: Breast Fed (right breast , areola to tough on the left breast to latch , see LC note ) Length of feed: 10 min  LATCH Score/Interventions Latch: Grasps breast easily, tongue down, lips flanged, rhythmical sucking. Intervention(s): Adjust position;Assist with latch;Breast massage;Breast compression  Audible Swallowing: Spontaneous and intermittent Intervention(s): Hand expression;Alternate breast massage  Type of Nipple: Everted at rest and after stimulation Intervention(s): Double electric pump;Shells  Comfort (Breast/Nipple): Filling, red/small blisters or bruises, mild/mod discomfort Problem noted: Engorgment Intervention(s): Ice;Hand expression  Problem noted: Filling Interventions (Filling): Double electric pump  Hold (Positioning): Assistance needed to correctly  position infant at breast and maintain latch. Intervention(s): Breastfeeding basics reviewed;Support Pillows;Position options;Skin to skin  LATCH Score: 8  Lactation Tools Discussed/Used Tools: Shells;Nipple Shields;Pump;Comfort gels Nipple shield size: 20;24 Shell Type: Inverted Breast pump type: Double-Electric Breast Pump   Consult Status Consult Status: Complete Date: 07/20/14 Follow-up type: In-patient    Kathrin Greathouseorio, Maddon Horton Ann 07/20/2014, 11:10 AM

## 2014-07-27 ENCOUNTER — Encounter: Payer: Managed Care, Other (non HMO) | Admitting: Neurology

## 2014-08-28 ENCOUNTER — Ambulatory Visit: Payer: Managed Care, Other (non HMO) | Admitting: Neurology

## 2015-05-11 ENCOUNTER — Other Ambulatory Visit: Payer: Self-pay | Admitting: Family Medicine

## 2015-05-11 DIAGNOSIS — N644 Mastodynia: Secondary | ICD-10-CM

## 2015-05-15 ENCOUNTER — Ambulatory Visit
Admission: RE | Admit: 2015-05-15 | Discharge: 2015-05-15 | Disposition: A | Discharge status: Home / Self Care | Payer: Managed Care, Other (non HMO) | Source: Ambulatory Visit | Attending: Family Medicine | Admitting: Family Medicine

## 2015-05-15 DIAGNOSIS — N644 Mastodynia: Secondary | ICD-10-CM

## 2015-05-17 ENCOUNTER — Ambulatory Visit (HOSPITAL_COMMUNITY)
Admission: RE | Admit: 2015-05-17 | Discharge: 2015-05-17 | Disposition: A | Discharge status: Home / Self Care | Payer: Managed Care, Other (non HMO) | Source: Ambulatory Visit | Attending: Obstetrics and Gynecology | Admitting: Obstetrics and Gynecology

## 2015-05-17 ENCOUNTER — Ambulatory Visit (HOSPITAL_COMMUNITY): Payer: Managed Care, Other (non HMO)

## 2015-05-17 NOTE — Lactation Note (Signed)
Lactation Consult  Mother's reason for visit:  Mom here for evaluation of breast pain Visit Type:  Outpatient Appointment Notes:  Mom is here for evaluation of recurrent right breast pain after nursing baby.  Mom is BF 2 times per day - 1 time in the am and 1 time a night for 20-40 minutes. Baby is breastfeeding mostly on the right breast, rarely on left. Mom was treated for mastitis 2 times - initially in September but Mom did not take medication as prescribed (reports was only taking med when pain was present not QID as prescribed so the treatment was repeated in November). Mom was then treated for possible yeast on her nipples with Clotrimazole/Mupericin.  She then developed the shooting pain in the right breast between 12:00-6:00 which was treated with Diflucan for yeast. Mom continues to have this shooting pain and now has developed some tenderness on the outer quadrant of the right breast. Mom has had an U/S of right breast which she reports was normal. Mom reports the shooting pain only occurs right after nursing her baby and pain radiates up and down the breast between 12:00 and 6:00.  Consult:  Initial Lactation Consultant:  Adriana LevinsGranger, Adriana Skinner    Mother's Name: Adriana Skinner    On exam of right breast. No nodules present, breasts are soft, no redness, some mild tenderness PS=1 with palpation of the outer quadrant. Mom reports the tenderness in the outer quadrant developed within the past few days, was at PS=5 now PS=1-2. No nipple discharge. Mom reports she has had no fever.  At this time, with symptoms described by Mom suspect she has ductal yeast in the right breast. Would recommend that she have MD prescribe another 2 weeks of Diflucan. Per Adriana Skinner and Adriana Skinner "Breastfeeding and Human Lactation, 5th ED" would recommend 400 mg loading dose day 1 followed by 100 mg BID for 2 weeks until pain free for a week. If this does not resolve pain, then recommend Mom see Dermatologist for evaluation  of type of yeast to be sure treatment is correct or to rule out bacterial infection although based on symptoms do not suspect a bacterial infection at this time. Advised Mom to stop all sugar intake, carbs.  Discussed with Mom that BF twice daily may be contributing to her symptoms. Mom reports her breast do develop nodules that are firm during the day. Advised this could cause clogged milk ducts leading to mastitis again. She may need to remove some breast milk during the day to prevent this over fullness. Discussed possible weaning baby and how to do this, Mom reports she enjoys the 2 feedings a day and not ready to wean but if this continues she probably will wean the baby.  Tried to reach Dr. Shaune Pollackonna Skinner today but she had left office. Discussed recommendation for another treatment with RN from office - Wallis BambergMary Skinner. She reported that she would refer with MD and get back with Adriana Skinner in the AM.

## 2015-06-23 IMAGING — CR DG LUMBAR SPINE 2-3V
3 series · 3 of 3 positions shown · non-contrast
Comparison: None.

CLINICAL DATA: Fall 3 weeks ago with persistent low back pain.

EXAM:
LUMBAR SPINE - 2-3 VIEW

[view not recorded (1 of 3)]
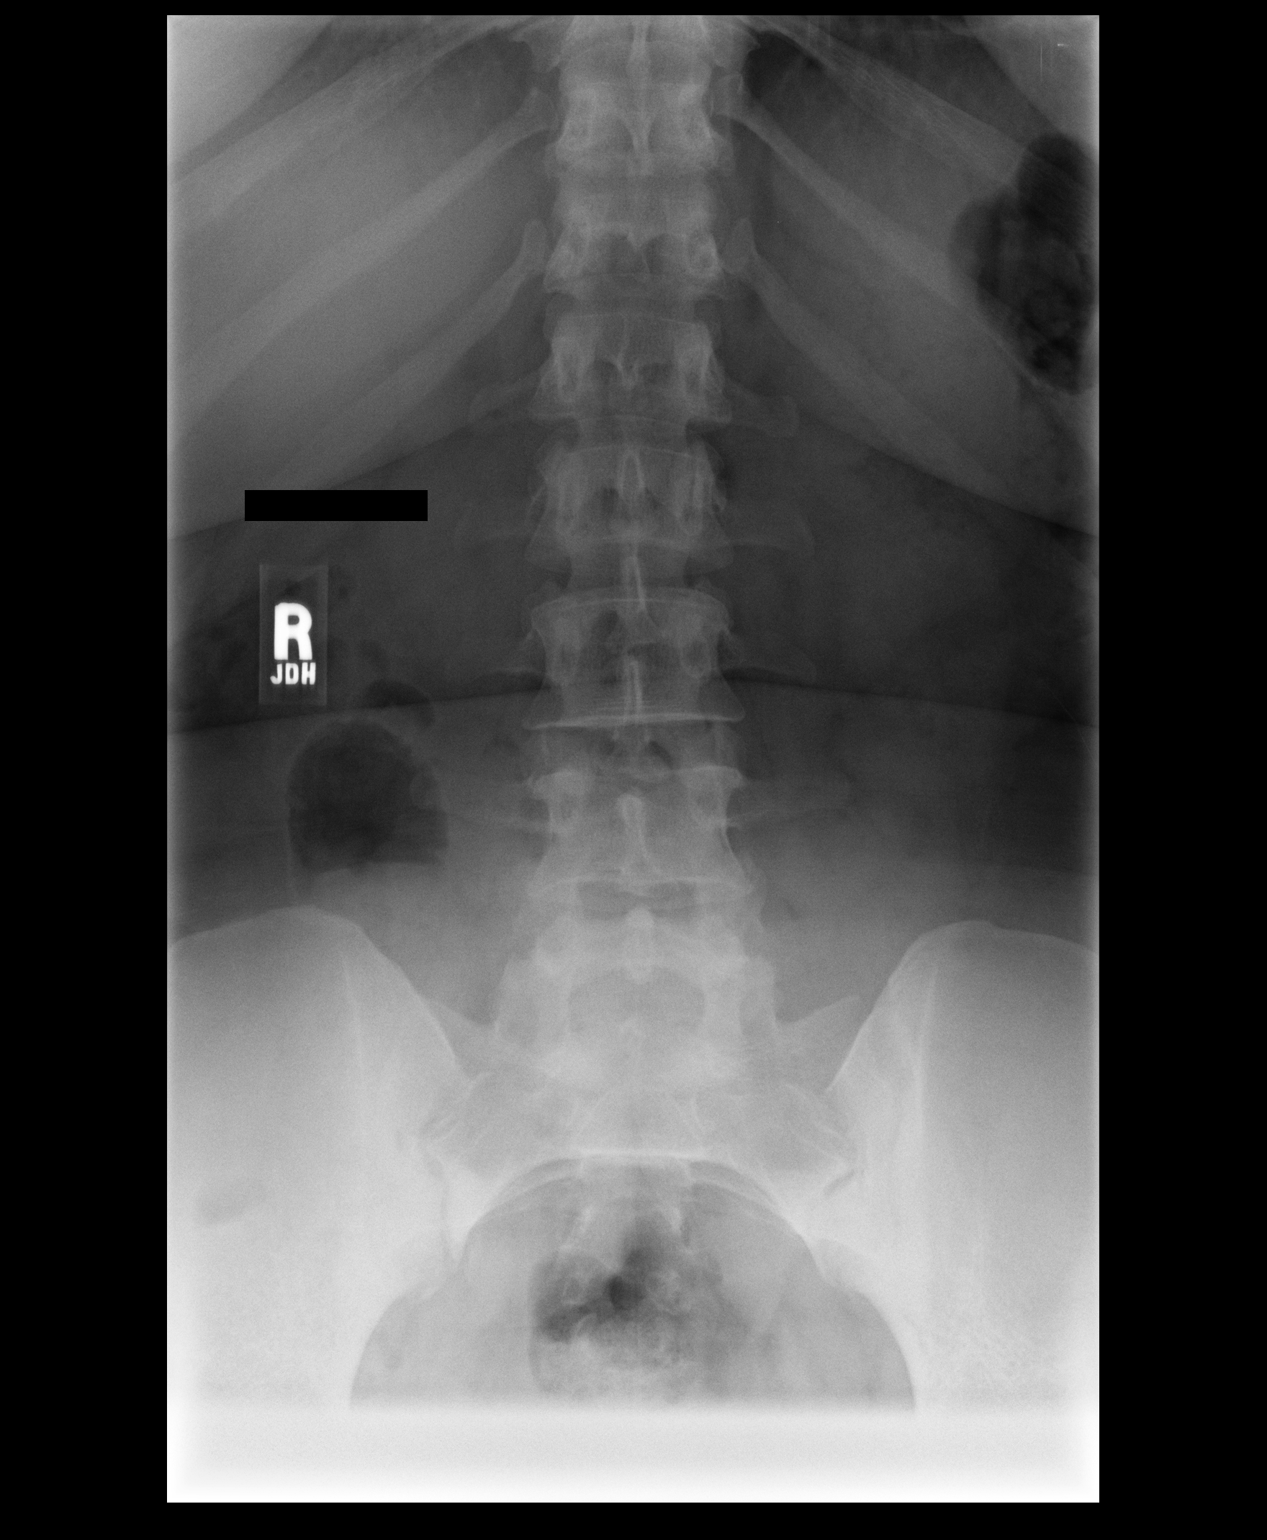

[view not recorded (2 of 3)]
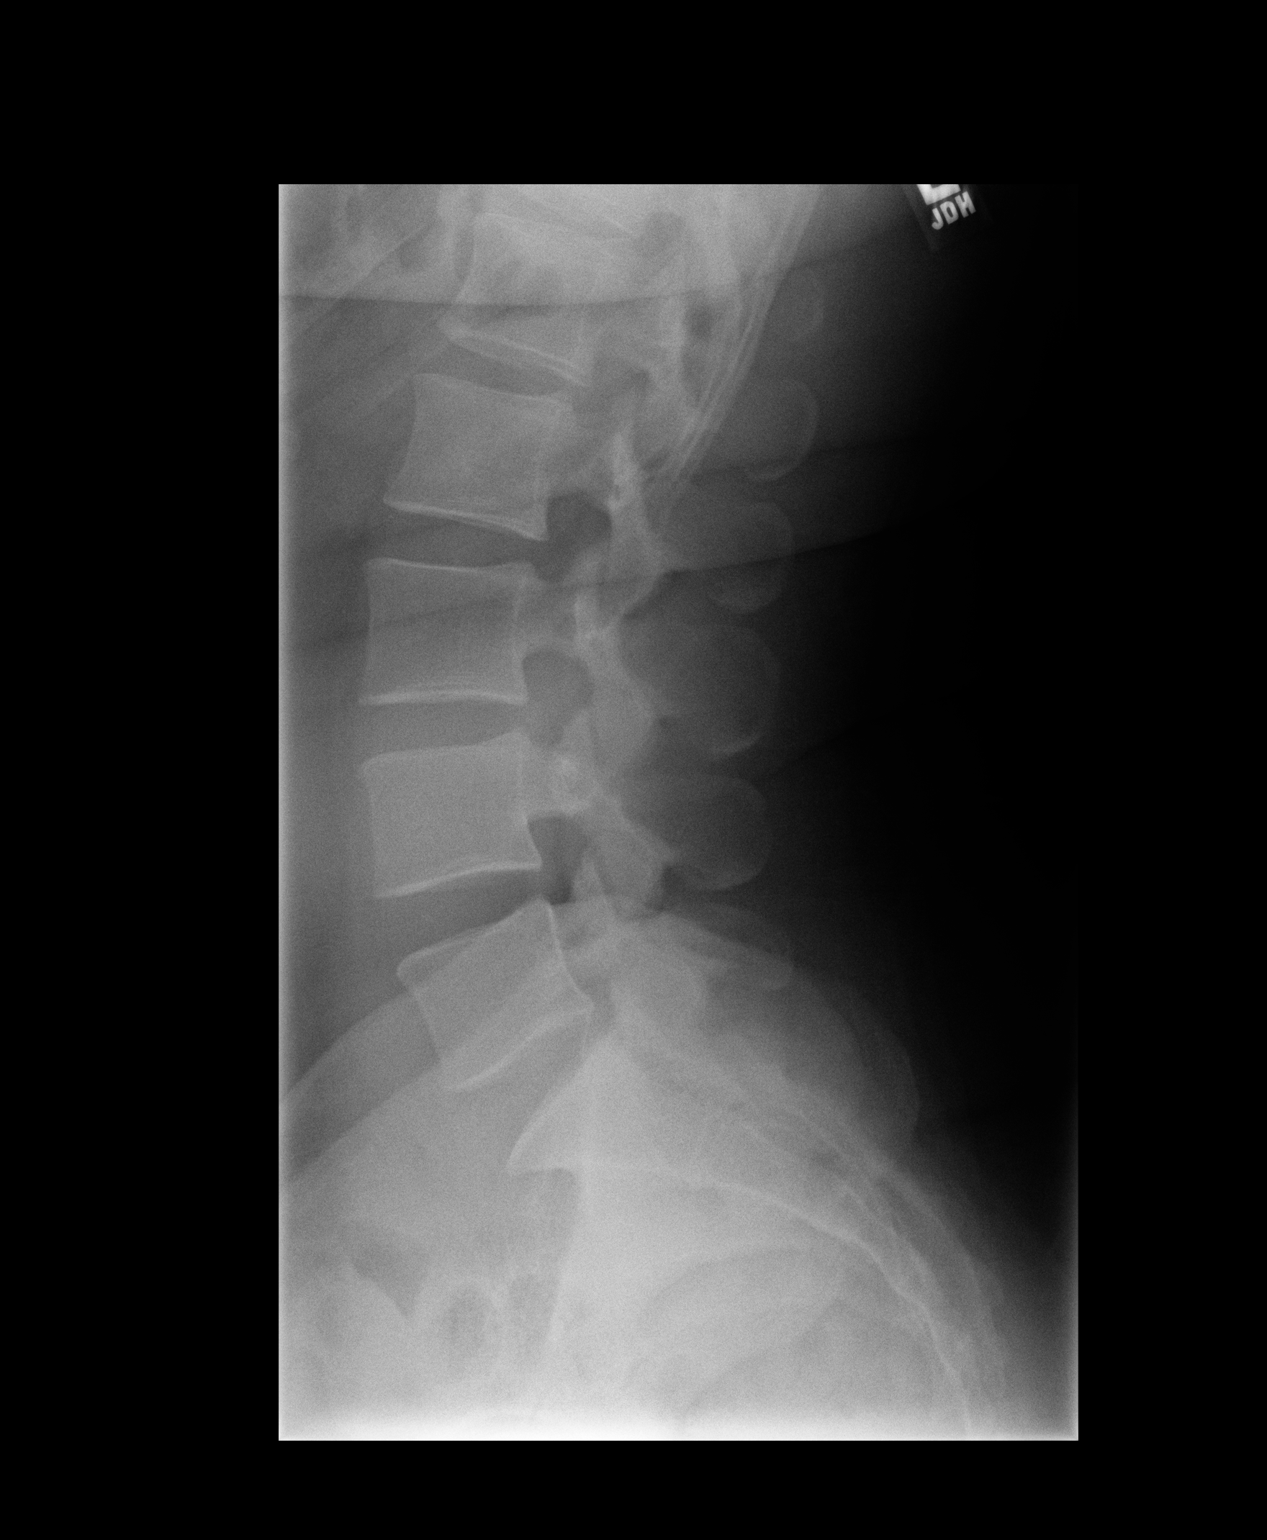

[view not recorded (3 of 3)]
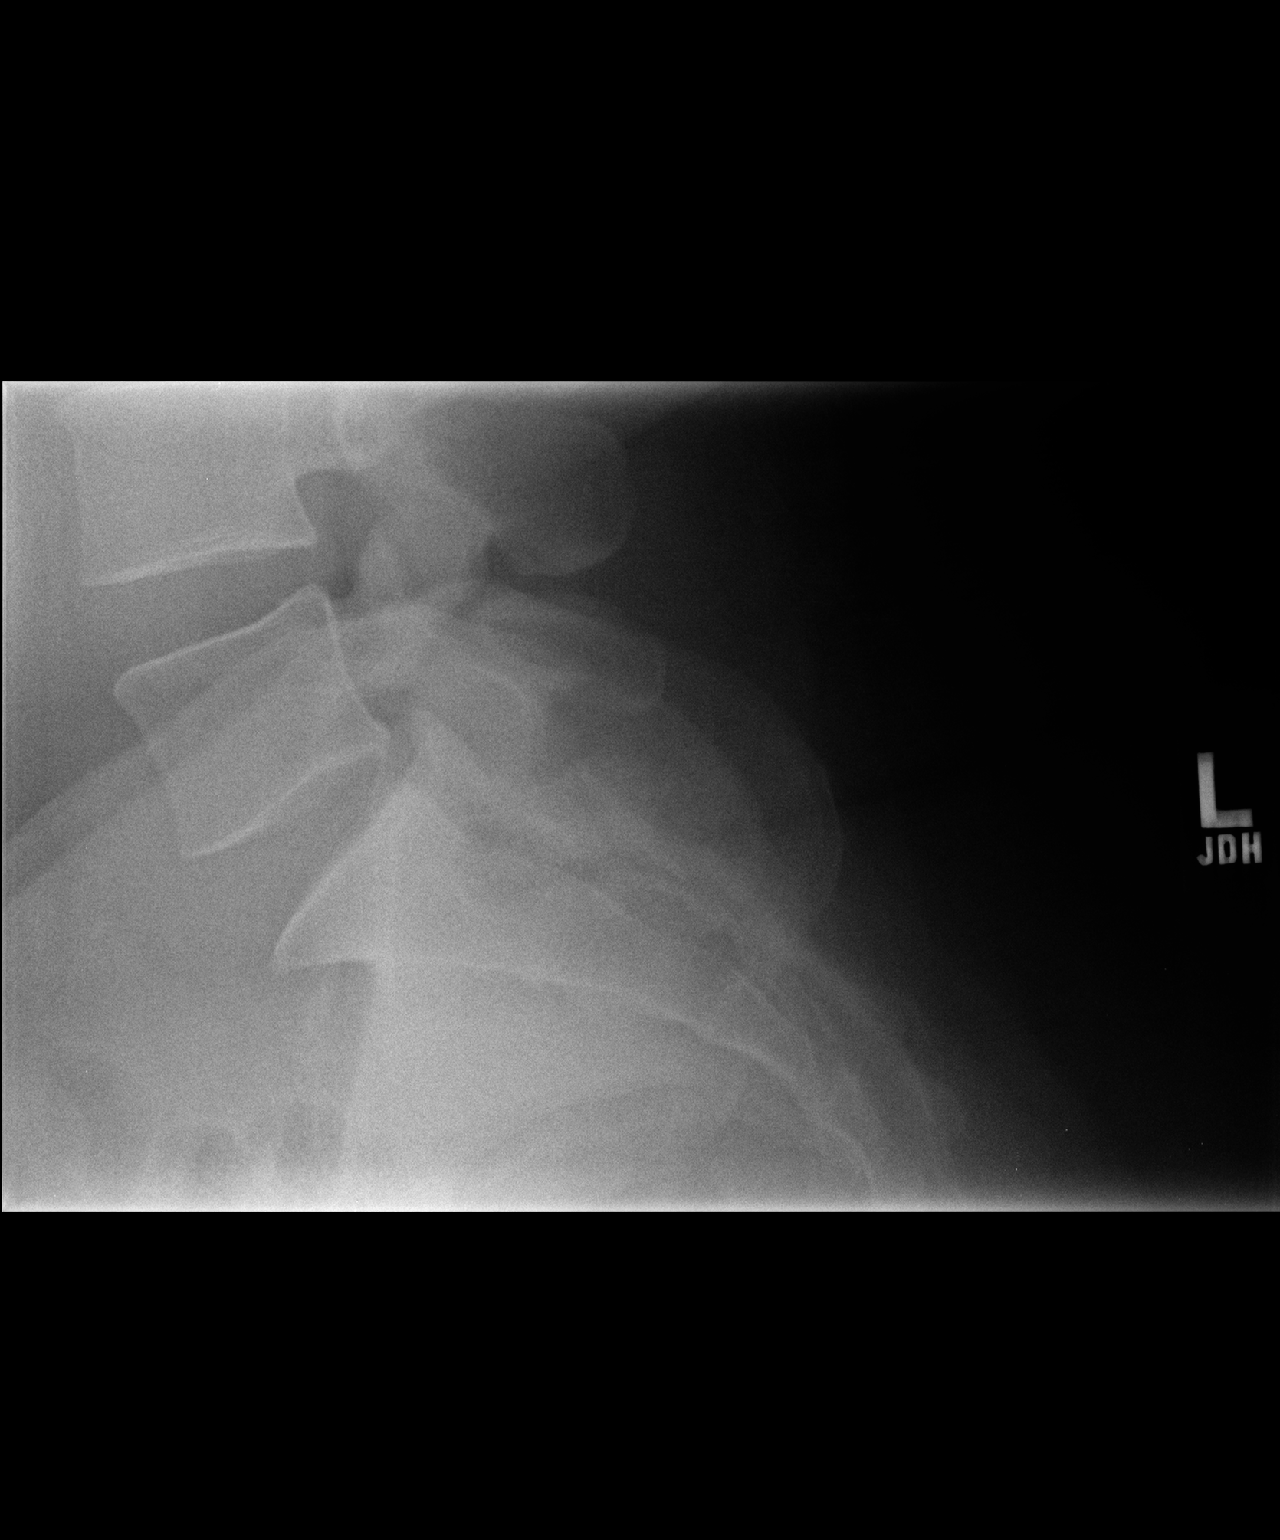

[3 of 3 positions shown; findings below may reference images not displayed]

FINDINGS: Alignment is anatomic. Vertebral body and disc space height are
maintained.
IMPRESSION: No acute findings.  No findings to explain the patient's pain.

## 2022-06-09 ENCOUNTER — Ambulatory Visit (HOSPITAL_COMMUNITY)
Admission: EM | Admit: 2022-06-09 | Discharge: 2022-06-09 | Disposition: A | Discharge status: Home / Self Care | Payer: BC Managed Care – PPO | Attending: Internal Medicine | Admitting: Internal Medicine

## 2022-06-09 ENCOUNTER — Encounter (HOSPITAL_COMMUNITY): Payer: Self-pay

## 2022-06-09 DIAGNOSIS — R509 Fever, unspecified: Secondary | ICD-10-CM | POA: Diagnosis present

## 2022-06-09 DIAGNOSIS — Z1152 Encounter for screening for COVID-19: Secondary | ICD-10-CM | POA: Diagnosis not present

## 2022-06-09 DIAGNOSIS — R0602 Shortness of breath: Secondary | ICD-10-CM | POA: Insufficient documentation

## 2022-06-09 DIAGNOSIS — J101 Influenza due to other identified influenza virus with other respiratory manifestations: Secondary | ICD-10-CM | POA: Diagnosis present

## 2022-06-09 LAB — POC INFLUENZA A AND B ANTIGEN (URGENT CARE ONLY)
INFLUENZA A ANTIGEN, POC: POSITIVE — AB
INFLUENZA B ANTIGEN, POC: NEGATIVE

## 2022-06-09 MED ORDER — PROMETHAZINE-DM 6.25-15 MG/5ML PO SYRP: 5.0000 mL | ORAL_SOLUTION | Freq: Every evening | ORAL | 0 refills | Status: AC | PRN

## 2022-06-09 MED ORDER — OSELTAMIVIR PHOSPHATE 75 MG PO CAPS: 75.0000 mg | ORAL_CAPSULE | Freq: Two times a day (BID) | ORAL | 0 refills | Status: AC

## 2022-06-09 MED ORDER — DEXAMETHASONE SODIUM PHOSPHATE 10 MG/ML IJ SOLN
10.0000 mg | Freq: Once | INTRAMUSCULAR | Status: AC
  Administered 2022-06-09: 10 mg via INTRAMUSCULAR

## 2022-06-09 MED ORDER — ACETAMINOPHEN 325 MG PO TABS
650.0000 mg | ORAL_TABLET | Freq: Once | ORAL | Status: AC
  Administered 2022-06-09: 650 mg via ORAL

## 2022-06-09 MED ORDER — IBUPROFEN 800 MG PO TABS
ORAL_TABLET | ORAL | Status: AC
  Filled 2022-06-09: qty 1

## 2022-06-09 MED ORDER — ACETAMINOPHEN 325 MG PO TABS
ORAL_TABLET | ORAL | Status: AC
  Filled 2022-06-09: qty 3

## 2022-06-09 MED ORDER — DEXAMETHASONE SODIUM PHOSPHATE 10 MG/ML IJ SOLN
INTRAMUSCULAR | Status: AC
  Filled 2022-06-09: qty 1

## 2022-06-09 MED ORDER — BENZONATATE 100 MG PO CAPS: 100.0000 mg | ORAL_CAPSULE | Freq: Three times a day (TID) | ORAL | 0 refills | Status: AC

## 2022-06-09 MED ORDER — ALBUTEROL SULFATE (2.5 MG/3ML) 0.083% IN NEBU
INHALATION_SOLUTION | RESPIRATORY_TRACT | Status: AC
  Filled 2022-06-09: qty 3

## 2022-06-09 MED ORDER — IBUPROFEN 800 MG PO TABS
800.0000 mg | ORAL_TABLET | Freq: Once | ORAL | Status: AC
  Administered 2022-06-09: 800 mg via ORAL

## 2022-06-09 MED ORDER — ALBUTEROL SULFATE HFA 108 (90 BASE) MCG/ACT IN AERS: 1.0000 | INHALATION_SPRAY | Freq: Four times a day (QID) | RESPIRATORY_TRACT | 0 refills | Status: AC | PRN

## 2022-06-09 MED ORDER — ALBUTEROL SULFATE (2.5 MG/3ML) 0.083% IN NEBU
2.5000 mg | INHALATION_SOLUTION | Freq: Once | RESPIRATORY_TRACT | Status: AC
  Administered 2022-06-09: 2.5 mg via RESPIRATORY_TRACT

## 2022-06-09 NOTE — ED Triage Notes (Signed)
Pt c/o cough, sore throat, fever, congestion, headache, wheezing, SOB on exertion, and body aches x2-3 days. Took tylenol at 4am this morning.

## 2022-06-09 NOTE — ED Provider Notes (Addendum)
[place]    CSN: [id number] Arrival date & time: 06/09/22  1502      History   Chief Complaint Chief Complaint  Patient presents with   Cough   Fever    HPI Adriana Skinner is a 42 y.o. female.   Patient presents to urgent care for evaluation of cough, nasal congestion, fever/chills, body aches, shortness of breath, and sore throat that started abruptly approximately 72 hours ago on Saturday June 07, 2021 while she was in [place] on a work trip.  Her friend that she was traveling with is sick with similar symptoms.  History of asthma as a child with history of frequent bronchitis as an adult.  She is not a smoker and denies drug use.  She is currently short of breath at rest and states that when she was traveling back to [place] in the airport, she had to use a wheelchair due to dyspnea with walking short distances.  Denies chest pain, heart palpitations, nausea, vomiting, dizziness, abdominal pain, diarrhea, anosmia, and lightheadedness.  She does report decreased oral intake since becoming sick as well as generalized headache without blurry vision or decreased visual acuity.  She is not diabetic.  She is vaccinated against influenza and COVID-19.  Has not attempted use of any over-the-counter medicines prior to arrival urgent care for symptoms.  States she has heard herself wheezing especially at nighttime.  No orthopnea or leg swelling reported.  History of hypertension, currently taking medications as prescribed and has not missed any doses.   Cough Associated symptoms: fever   Fever Associated symptoms: cough     Past Medical History:  Diagnosis Date   Termination of pregnancy (fetus) 2001, 2005   x 2    Patient Active Problem List   Diagnosis Date Noted   S/P repeat low transverse C-section 07/17/2014    Past Surgical History:  Procedure Laterality Date   c-section  2007, 2008   x 2 both in [place] N/A 07/17/2014    Procedure: CESAREAN SECTION-repeat ;  Surgeon: Logan Bores, MD;  Location: [facility];  Service: Obstetrics;  Laterality: N/A;   wisdom teeht ext      OB History     Gravida  5   Para  3   Term  3   Preterm      AB  2   Living  2      SAB      IAB  2   Ectopic      Multiple  0   Live Births  2            Home Medications    Prior to Admission medications   Medication Sig Start Date End Date Taking? Authorizing Provider  albuterol (VENTOLIN HFA) 108 (90 Base) MCG/ACT inhaler Inhale 1-2 puffs into the lungs every 6 (six) hours as needed for wheezing or shortness of breath. 06/09/22  Yes Talbot Grumbling, FNP  benzonatate (TESSALON) 100 MG capsule Take 1 capsule (100 mg total) by mouth every 8 (eight) hours. 06/09/22  Yes Talbot Grumbling, FNP  oseltamivir (TAMIFLU) 75 MG capsule Take 1 capsule (75 mg total) by mouth every 12 (twelve) hours. 06/09/22  Yes Talbot Grumbling, FNP  promethazine-dextromethorphan (PROMETHAZINE-DM) 6.25-15 MG/5ML syrup Take 5 mLs by mouth at bedtime as needed for cough. 06/09/22  Yes Talbot Grumbling, FNP  amLODipine (NORVASC) 10 MG tablet TAKE 1 TABLET BY MOUTH EVERY DAY FOR  30 DAYS    [provider]    Family History Family History  Problem Relation Age of Onset   Healthy Mother    Heart failure Father    Cancer Father    Diabetes Mellitus II Father    Healthy Brother        x3   Healthy Daughter    Healthy Son     Social History Social History   Tobacco Use   Smoking status: Never   Smokeless tobacco: Never  Substance Use Topics   Alcohol use: Yes    Alcohol/week: 0.0 standard drinks of alcohol    Comment: socially but none with pregnancy   Drug use: No     Allergies   Patient has no known allergies.   Review of Systems Review of Systems  Constitutional:  Positive for fever.  Respiratory:  Positive for cough.   Per HPI   Physical Exam Triage Vital Signs ED Triage Vitals  Enc  Vitals Group     BP 06/09/22 1751 (!) 135/113     Pulse Rate 06/09/22 1751 61     Resp 06/09/22 1751 20     Temp 06/09/22 1751 (!) 103.2 F (39.6 C)     Temp Source 06/09/22 1751 Oral     SpO2 06/09/22 1751 96 %     Weight --      Height --      Head Circumference --      Peak Flow --      Pain Score 06/09/22 1752 10     Pain Loc --      Pain Edu? --      Excl. in GC? --    No data found.  Updated Vital Signs BP (!) 135/113 (BP Location: Right Arm)   Pulse 61   Temp (!) 103.2 F (39.6 C) (Oral)   Resp 20   SpO2 96%   Breastfeeding No   Visual Acuity Right Eye Distance:   Left Eye Distance:   Bilateral Distance:    Right Eye Near:   Left Eye Near:    Bilateral Near:     Physical Exam Vitals and nursing note reviewed.  Constitutional:      Appearance: She is ill-appearing. She is not toxic-appearing.  HENT:     Head: Normocephalic and atraumatic.     Right Ear: Hearing, tympanic membrane, ear canal and external ear normal.     Left Ear: Hearing, tympanic membrane, ear canal and external ear normal.     Nose: Congestion present.     Mouth/Throat:     Lips: Pink.     Mouth: Mucous membranes are moist.     Pharynx: Posterior oropharyngeal erythema present.  Eyes:     General: Lids are normal. Vision grossly intact. Gaze aligned appropriately.        Right eye: No discharge.        Left eye: No discharge.     Extraocular Movements: Extraocular movements intact.     Conjunctiva/sclera: Conjunctivae normal.  Cardiovascular:     Rate and Rhythm: Normal rate and regular rhythm.     Heart sounds: Normal heart sounds, S1 normal and S2 normal.  Pulmonary:     Effort: Pulmonary effort is normal. No respiratory distress.     Breath sounds: Normal air entry. Wheezing present.     Comments: Faint expiratory wheezes heard to bilateral upper lung fields without respiratory distress. Speaking in full sentences without difficulty.  Musculoskeletal:  Cervical back: Neck  supple.     Right lower leg: No edema.     Left lower leg: No edema.  Lymphadenopathy:     Cervical: Cervical adenopathy present.  Skin:    General: Skin is warm and dry.     Capillary Refill: Capillary refill takes less than 2 seconds.     Findings: No rash.  Neurological:     General: No focal deficit present.     Mental Status: She is alert and oriented to person, place, and time. Mental status is at baseline.     Cranial Nerves: No dysarthria or facial asymmetry.     Motor: No weakness.     Gait: Gait normal.  Psychiatric:        Mood and Affect: Mood normal.        Speech: Speech normal.        Behavior: Behavior normal.        Thought Content: Thought content normal.        Judgment: Judgment normal.      UC Treatments / Results  Labs (all labs ordered are listed, but only abnormal results are displayed) Labs Reviewed  POC INFLUENZA A AND B ANTIGEN (URGENT CARE ONLY) - Abnormal; Notable for the following components:      Result Value   INFLUENZA A ANTIGEN, POC POSITIVE (*)    All other components within normal limits  SARS CORONAVIRUS 2 (TAT 6-24 HRS)    EKG   Radiology No results found.  Procedures Procedures (including critical care time)  Medications Ordered in UC Medications  albuterol (PROVENTIL) (2.5 MG/3ML) 0.083% nebulizer solution 2.5 mg (has no administration in time range)  acetaminophen (TYLENOL) tablet 650 mg (650 mg Oral Given 06/09/22 1800)  ibuprofen (ADVIL) tablet 800 mg (800 mg Oral Given 06/09/22 1801)    Initial Impression / Assessment and Plan / UC Course  I have reviewed the triage vital signs and the nursing notes.  Pertinent labs & imaging results that were available during my care of the patient were reviewed by me and considered in my medical decision making (see chart for details).   1. Influenza A Symptoms and physical exam consistent with a viral upper respiratory tract infection that will likely resolve with rest, fluids, and  prescriptions for symptomatic relief. Influenza A test positive in clinic. Deferred imaging based on stable cardiopulmonary exam and hemodynamically stable vital signs. COVID-19 testing is pending.  We will call patient if this is positive. Quarantine guidelines discussed. Currently on day 3 of symptoms and does qualify for antiviral therapy.   Patient given albuterol nebulizer, ibuprofen 800mg , and tylenol 650mg  in clinic today for fever, chills, and cough, wheeze, and shortness of breath.  Patient given 10mg  decadron IM to reduce inflammation to the lungs contributing to wheezing and shortness of breath. Shortness of breath and wheezing significantly improved after albuterol nebulizer treatment in clinic. Tamiflu, tessalon perles, promethazine DM, and albuterol inhaler sent to pharmacy for symptomatic relief to be taken as prescribed.  May continue taking over the counter medications as directed for further symptomatic relief.  Drowsiness precautions discussed regarding promethazine DM prescription.  Nonpharmacologic interventions for symptom relief provided and after visit summary below.  Advised to push fluids to stay well hydrated while recovering from viral illness.  Temperature reduced to 99.8 in clinic prior to discharge.  Discussed physical exam and available lab work findings in clinic with patient.  Counseled patient regarding appropriate use of medications and potential side effects  for all medications recommended or prescribed today. Discussed red flag signs and symptoms of worsening condition,when to call the PCP office, return to urgent care, and when to seek higher level of care in the emergency department. Patient verbalizes understanding and agreement with plan. All questions answered. Patient discharged in stable condition.     Final Clinical Impressions(s) / UC Diagnoses   Final diagnoses:  Influenza A  Shortness of breath  Fever, unspecified fever cause     Discharge  Instructions      You have influenza A. Take Tamiflu as directed. COVID-19 testing is pending. We will call you with results if positive. If your COVID test is positive, you must stay at home until day 6 of symptoms. On day 6, you may go out into public and go back to work, but you must wear a mask until day 11 of symptoms to prevent spread to others.  Use the following medicines to help with symptoms: - Plain Mucinex (guaifenesin) over the counter as directed every 12 hours to thin mucous so that you are able to get it out of your body easier. Drink plenty of water while taking this medication so that it works well in your body (at least 8 cups a day).  - Tylenol 1,000mg  and/or ibuprofen 600mg  every 6 hours with food as needed for aches/pains or fever/chills.  - Tessalon perles every 8 hours as needed for cough. - Take Promethazine DM cough medication to help with your cough at nighttime so that you are able to sleep. Do not drive, drink alcohol, or go to work while taking this medication since it can make you sleepy. Only take this at nighttime.  - Albuterol 1-2 puffs at home every 4-6 hours as needed for cough, shortness of breath, and wheeze.  1 tablespoon of honey in warm water and/or salt water gargles may also help with symptoms. Humidifier to your room will help add water to the air and reduce coughing.  If you develop any new or worsening symptoms, please return.  If your symptoms are severe, please go to the emergency room.  Follow-up with your primary care provider for further evaluation and management of your symptoms as well as ongoing wellness visits.  I hope you feel better!      ED Prescriptions     Medication Sig Dispense Auth. Provider   oseltamivir (TAMIFLU) 75 MG capsule Take 1 capsule (75 mg total) by mouth every 12 (twelve) hours. 10 capsule Talbot Grumbling, FNP   albuterol (VENTOLIN HFA) 108 (90 Base) MCG/ACT inhaler Inhale 1-2 puffs into the lungs every 6 (six)  hours as needed for wheezing or shortness of breath. 8 g Joella Prince M, FNP   benzonatate (TESSALON) 100 MG capsule Take 1 capsule (100 mg total) by mouth every 8 (eight) hours. 21 capsule Talbot Grumbling, FNP   promethazine-dextromethorphan (PROMETHAZINE-DM) 6.25-15 MG/5ML syrup Take 5 mLs by mouth at bedtime as needed for cough. 118 mL Talbot Grumbling, FNP      PDMP not reviewed this encounter.   Talbot Grumbling, [place] 06/09/22 [place], [place], FNP 06/09/22 1924

## 2022-06-09 NOTE — Discharge Instructions (Signed)
You have influenza A. Take Tamiflu as directed. COVID-19 testing is pending. We will call you with results if positive. If your COVID test is positive, you must stay at home until day 6 of symptoms. On day 6, you may go out into public and go back to work, but you must wear a mask until day 11 of symptoms to prevent spread to others.  Use the following medicines to help with symptoms: - Plain Mucinex (guaifenesin) over the counter as directed every 12 hours to thin mucous so that you are able to get it out of your body easier. Drink plenty of water while taking this medication so that it works well in your body (at least 8 cups a day).  - Tylenol 1,000mg  and/or ibuprofen 600mg  every 6 hours with food as needed for aches/pains or fever/chills.  - Tessalon perles every 8 hours as needed for cough. - Take Promethazine DM cough medication to help with your cough at nighttime so that you are able to sleep. Do not drive, drink alcohol, or go to work while taking this medication since it can make you sleepy. Only take this at nighttime.  - Albuterol 1-2 puffs at home every 4-6 hours as needed for cough, shortness of breath, and wheeze.  1 tablespoon of honey in warm water and/or salt water gargles may also help with symptoms. Humidifier to your room will help add water to the air and reduce coughing.  If you develop any new or worsening symptoms, please return.  If your symptoms are severe, please go to the emergency room.  Follow-up with your primary care provider for further evaluation and management of your symptoms as well as ongoing wellness visits.  I hope you feel better!

## 2022-06-10 LAB — SARS CORONAVIRUS 2 (TAT 6-24 HRS): SARS Coronavirus 2: NEGATIVE

## 2022-06-12 ENCOUNTER — Other Ambulatory Visit: Payer: Self-pay

## 2022-06-12 ENCOUNTER — Encounter (HOSPITAL_COMMUNITY): Payer: Self-pay

## 2022-06-12 ENCOUNTER — Ambulatory Visit (HOSPITAL_COMMUNITY)
Admission: RE | Admit: 2022-06-12 | Discharge: 2022-06-12 | Disposition: A | Discharge status: Home / Self Care | Payer: BC Managed Care – PPO | Source: Ambulatory Visit | Attending: Emergency Medicine | Admitting: Emergency Medicine

## 2022-06-12 VITALS — BP 114/82 | HR 95 | Temp 98.4°F | Resp 22

## 2022-06-12 DIAGNOSIS — J4520 Mild intermittent asthma, uncomplicated: Secondary | ICD-10-CM

## 2022-06-12 DIAGNOSIS — J111 Influenza due to unidentified influenza virus with other respiratory manifestations: Secondary | ICD-10-CM | POA: Diagnosis not present

## 2022-06-12 MED ORDER — PREDNISONE 20 MG PO TABS: 40.0000 mg | ORAL_TABLET | Freq: Every day | ORAL | 0 refills | Status: AC

## 2022-06-12 NOTE — ED Triage Notes (Signed)
Continues having sob, can hear crackling sounds.  Patient concerned she needed additional treatment for symptoms that aren't improving since being seen on 06/09/2022

## 2022-06-12 NOTE — ED Provider Notes (Signed)
[place]    CSN: [id number] Arrival date & time: 06/12/22  1403     History   Chief Complaint Chief Complaint  Patient presents with   Shortness of Breath   Appointment    14:00    HPI Adriana Skinner is a 42 y.o. female.  Presents with shortness of breath on exertion Seen 3 days ago and diagnosed with flu Has been using tamiflu, promethazine, tessalon Reports flu symptoms have mostly resolved, but shortness of breath has persisted Not SHOB at rest  Hx asthma, maybe feels some wheezing Has used inhaler occasionally Was given steroid injection at initial visit with improvement of symptoms  Used humidifier last night that improved her a lot  Past Medical History:  Diagnosis Date   Termination of pregnancy (fetus) 2001, 2005   x 2    Patient Active Problem List   Diagnosis Date Noted   S/P repeat low transverse C-section 07/17/2014    Past Surgical History:  Procedure Laterality Date   c-section  2007, 2008   x 2 both in [place] N/A 07/17/2014   Procedure: CESAREAN SECTION-repeat ;  Surgeon: Logan Bores, MD;  Location: [facility];  Service: Obstetrics;  Laterality: N/A;   wisdom teeht ext      OB History     Gravida  5   Para  3   Term  3   Preterm      AB  2   Living  2      SAB      IAB  2   Ectopic      Multiple  0   Live Births  2            Home Medications    Prior to Admission medications   Medication Sig Start Date End Date Taking? Authorizing Provider  predniSONE (DELTASONE) 20 MG tablet Take 2 tablets (40 mg total) by mouth daily for 5 days. 06/12/22 06/17/22 Yes Melissa Tomaselli, Wells Guiles, PA-C  albuterol (VENTOLIN HFA) 108 (90 Base) MCG/ACT inhaler Inhale 1-2 puffs into the lungs every 6 (six) hours as needed for wheezing or shortness of breath. 06/09/22   Talbot Grumbling, FNP  amLODipine (NORVASC) 10 MG tablet TAKE 1 TABLET BY MOUTH EVERY DAY FOR 30 DAYS    [provider]  benzonatate  (TESSALON) 100 MG capsule Take 1 capsule (100 mg total) by mouth every 8 (eight) hours. 06/09/22   Talbot Grumbling, FNP  oseltamivir (TAMIFLU) 75 MG capsule Take 1 capsule (75 mg total) by mouth every 12 (twelve) hours. 06/09/22   Talbot Grumbling, FNP  promethazine-dextromethorphan (PROMETHAZINE-DM) 6.25-15 MG/5ML syrup Take 5 mLs by mouth at bedtime as needed for cough. 06/09/22   Talbot Grumbling, FNP    Family History Family History  Problem Relation Age of Onset   Healthy Mother    Heart failure Father    Cancer Father    Diabetes Mellitus II Father    Healthy Brother        x3   Healthy Daughter    Healthy Son     Social History Social History   Tobacco Use   Smoking status: Never   Smokeless tobacco: Never  Vaping Use   Vaping Use: Never used  Substance Use Topics   Alcohol use: Yes    Alcohol/week: 0.0 standard drinks of alcohol    Comment: socially but none with pregnancy   Drug use: No  Allergies   Patient has no known allergies.   Review of Systems Review of SystemsAs per HPI   Physical Exam Triage Vital Signs ED Triage Vitals  Enc Vitals Group     BP 06/12/22 1449 114/82     Pulse Rate 06/12/22 1449 95     Resp 06/12/22 1449 (!) 22     Temp 06/12/22 1449 98.4 F (36.9 C)     Temp Source 06/12/22 1449 Oral     SpO2 06/12/22 1449 97 %     Weight --      Height --      Head Circumference --      Peak Flow --      Pain Score 06/12/22 1446 0     Pain Loc --      Pain Edu? --      Excl. in GC? --    No data found.  Updated Vital Signs BP 114/82 (BP Location: Right Arm) Comment (BP Location): large  Pulse 95   Temp 98.4 F (36.9 C) (Oral)   Resp (!) 22   SpO2 97%    Physical Exam Vitals and nursing note reviewed.  Constitutional:      General: She is not in acute distress. HENT:     Nose: No congestion or rhinorrhea.     Mouth/Throat:     Mouth: Mucous membranes are moist.     Pharynx: Oropharynx is clear.  Eyes:      Conjunctiva/sclera: Conjunctivae normal.  Cardiovascular:     Rate and Rhythm: Normal rate and regular rhythm.     Pulses: Normal pulses.     Heart sounds: Normal heart sounds.  Pulmonary:     Effort: Pulmonary effort is normal. No respiratory distress.     Breath sounds: Wheezing present.     Comments: Faint expiratory wheezes throughout Musculoskeletal:     Cervical back: Normal range of motion.  Skin:    General: Skin is warm and dry.  Neurological:     Mental Status: She is alert and oriented to person, place, and time.      UC Treatments / Results  Labs (all labs ordered are listed, but only abnormal results are displayed) Labs Reviewed - No data to display  EKG   Radiology No results found.  Procedures Procedures   Medications Ordered in UC Medications - No data to display  Initial Impression / Assessment and Plan / UC Course  I have reviewed the triage vital signs and the nursing notes.  Pertinent labs & imaging results that were available during my care of the patient were reviewed by me and considered in my medical decision making (see chart for details).  Lungs with faint wheezing, expected given respiratory illness with history of asthma No shortness of breath on exam, no indication for x-ray imaging today. I recommend she use her inhaler every 6 hours for the next several days, and then continue as needed.  This in combination with once daily prednisone for 5 days should help to open up the lungs and decrease inflammation. Continue other symptomatic care as well. Discussed there is no indication for antibiotics at this time.  Discussed worsening symptoms should be reevaluated.  She is agreeable to plan  Final Clinical Impressions(s) / UC Diagnoses   Final diagnoses:  Influenza  Mild intermittent asthma without complication     Discharge Instructions      Continue Tessalon, Tamiflu, promethazine  I recommend to use your albuterol inhaler  every 6  hours for the next several days.  And continue as needed. This in combination with the prednisone once daily for 5 days should help open up your lungs.  Continue humidifier, prop up at night  Please return with any concerns, or go to the emergency department with worsening symptoms.     ED Prescriptions     Medication Sig Dispense Auth. Provider   predniSONE (DELTASONE) 20 MG tablet Take 2 tablets (40 mg total) by mouth daily for 5 days. 10 tablet Karyme Mcconathy, Wells Guiles, PA-C      PDMP not reviewed this encounter.   Les [place], [place] 06/12/22 1539

## 2022-06-12 NOTE — Discharge Instructions (Addendum)
Continue Tessalon, Tamiflu, promethazine  I recommend to use your albuterol inhaler every 6 hours for the next several days.  And continue as needed. This in combination with the prednisone once daily for 5 days should help open up your lungs.  Continue humidifier, prop up at night  Please return with any concerns, or go to the emergency department with worsening symptoms.
